# Patient Record
Sex: Female | Born: 1975 | Race: White | Hispanic: No | Marital: Married | Smoking: Never smoker
Health system: Southern US, Community
[De-identification: ages and names within clinical notes are randomized; demographics above are authoritative.]

## PROBLEM LIST (undated history)

## (undated) DIAGNOSIS — F329 Major depressive disorder, single episode, unspecified: Secondary | ICD-10-CM

## (undated) DIAGNOSIS — F32A Depression, unspecified: Secondary | ICD-10-CM

## (undated) DIAGNOSIS — Z6791 Unspecified blood type, Rh negative: Secondary | ICD-10-CM

## (undated) DIAGNOSIS — K649 Unspecified hemorrhoids: Secondary | ICD-10-CM

## (undated) DIAGNOSIS — O26899 Other specified pregnancy related conditions, unspecified trimester: Secondary | ICD-10-CM

## (undated) DIAGNOSIS — E039 Hypothyroidism, unspecified: Secondary | ICD-10-CM

## (undated) HISTORY — DX: Unspecified hemorrhoids: K64.9

## (undated) HISTORY — DX: Major depressive disorder, single episode, unspecified: F32.9

## (undated) HISTORY — PX: EYE SURGERY: SHX253

## (undated) HISTORY — DX: Depression, unspecified: F32.A

## (undated) HISTORY — DX: Hypothyroidism, unspecified: E03.9

## (undated) HISTORY — PX: WISDOM TOOTH EXTRACTION: SHX21

---

## 2006-10-05 LAB — HM MAMMOGRAPHY: HM Mammogram: NORMAL

## 2011-05-21 NOTE — L&D Delivery Note (Signed)
Delivery Note At 1:29 PM a viable and healthy female was delivered via  (Presentation:LOA ).  APGAR:9 ,9 ; weight pending .   Placenta status: spontaneous, intact . 3 vessel Cord:  with the following complications: none .  Cord pH: na  Anesthesia:  Local Episiotomy: none Lacerations: second degree Suture Repair: 2.0 vicryl rapide Est. Blood Loss (mL): 300  Mom to postpartum.  Baby to nursery-stable.  [name] J 02/10/2012, 1:43 PM

## 2011-06-26 LAB — OB RESULTS CONSOLE GC/CHLAMYDIA: Chlamydia: NEGATIVE

## 2011-11-18 LAB — OB RESULTS CONSOLE HEPATITIS B SURFACE ANTIGEN: Hepatitis B Surface Ag: NEGATIVE

## 2011-11-18 LAB — OB RESULTS CONSOLE ABO/RH

## 2011-11-18 LAB — OB RESULTS CONSOLE RUBELLA ANTIBODY, IGM: Rubella: IMMUNE

## 2011-11-18 LAB — OB RESULTS CONSOLE ANTIBODY SCREEN: Antibody Screen: NEGATIVE

## 2012-01-27 ENCOUNTER — Telehealth (HOSPITAL_COMMUNITY): Payer: Self-pay | Admitting: *Deleted

## 2012-01-27 ENCOUNTER — Encounter (HOSPITAL_COMMUNITY): Payer: Self-pay | Admitting: *Deleted

## 2012-01-27 NOTE — Telephone Encounter (Signed)
Preadmission screen  

## 2012-01-30 ENCOUNTER — Other Ambulatory Visit: Payer: Self-pay | Admitting: Obstetrics and Gynecology

## 2012-02-06 ENCOUNTER — Other Ambulatory Visit: Payer: Self-pay | Admitting: Obstetrics and Gynecology

## 2012-02-09 ENCOUNTER — Encounter (HOSPITAL_COMMUNITY): Payer: Self-pay | Admitting: Pharmacist

## 2012-02-10 ENCOUNTER — Encounter (HOSPITAL_COMMUNITY): Payer: Self-pay

## 2012-02-10 ENCOUNTER — Inpatient Hospital Stay (HOSPITAL_COMMUNITY)
Admission: RE | Admit: 2012-02-10 | Discharge: 2012-02-12 | DRG: 373 | Disposition: A | Discharge status: Home / Self Care | Payer: BC Managed Care – PPO | Source: Ambulatory Visit | Attending: Obstetrics and Gynecology | Admitting: Obstetrics and Gynecology

## 2012-02-10 DIAGNOSIS — O26899 Other specified pregnancy related conditions, unspecified trimester: Secondary | ICD-10-CM | POA: Diagnosis present

## 2012-02-10 DIAGNOSIS — Z6791 Unspecified blood type, Rh negative: Secondary | ICD-10-CM

## 2012-02-10 DIAGNOSIS — O99892 Other specified diseases and conditions complicating childbirth: Secondary | ICD-10-CM | POA: Diagnosis present

## 2012-02-10 HISTORY — DX: Unspecified blood type, rh negative: Z67.91

## 2012-02-10 HISTORY — DX: Other specified pregnancy related conditions, unspecified trimester: O26.899

## 2012-02-10 LAB — TYPE AND SCREEN
ABO/RH(D): B NEG
DAT, IgG: NEGATIVE

## 2012-02-10 LAB — RPR: RPR Ser Ql: NONREACTIVE

## 2012-02-10 LAB — CBC
HCT: 31.7 % — ABNORMAL LOW (ref 36.0–46.0)
Hemoglobin: 10.8 g/dL — ABNORMAL LOW (ref 12.0–15.0)
MCH: 29.8 pg (ref 26.0–34.0)
RBC: 3.63 MIL/uL — ABNORMAL LOW (ref 3.87–5.11)

## 2012-02-10 MED ORDER — INFLUENZA VIRUS VACC SPLIT PF IM SUSP
0.5000 mL | INTRAMUSCULAR | Status: AC
  Administered 2012-02-11: 0.5 mL via INTRAMUSCULAR
  Filled 2012-02-10: qty 0.5

## 2012-02-10 MED ORDER — OXYCODONE-ACETAMINOPHEN 5-325 MG PO TABS: 1.0000 | ORAL_TABLET | ORAL | Status: DC | PRN

## 2012-02-10 MED ORDER — TERBUTALINE SULFATE 1 MG/ML IJ SOLN: 0.2500 mg | Freq: Once | INTRAMUSCULAR | Status: DC | PRN

## 2012-02-10 MED ORDER — ONDANSETRON HCL 4 MG/2ML IJ SOLN: 4.0000 mg | INTRAMUSCULAR | Status: DC | PRN

## 2012-02-10 MED ORDER — LANOLIN HYDROUS EX OINT: TOPICAL_OINTMENT | CUTANEOUS | Status: DC | PRN

## 2012-02-10 MED ORDER — OXYTOCIN BOLUS FROM INFUSION
500.0000 mL | Freq: Once | INTRAVENOUS | Status: AC
  Administered 2012-02-10: 500 mL via INTRAVENOUS
  Filled 2012-02-10: qty 500

## 2012-02-10 MED ORDER — HYDROCORTISONE 2.5 % RE CREA
1.0000 "application " | TOPICAL_CREAM | Freq: Two times a day (BID) | RECTAL | Status: DC
  Filled 2012-02-10: qty 28.35

## 2012-02-10 MED ORDER — BENZOCAINE-MENTHOL 20-0.5 % EX AERO
1.0000 "application " | INHALATION_SPRAY | CUTANEOUS | Status: DC | PRN
  Administered 2012-02-11: 1 via TOPICAL
  Filled 2012-02-10: qty 56

## 2012-02-10 MED ORDER — PRENATAL MULTIVITAMIN CH
1.0000 | ORAL_TABLET | Freq: Every day | ORAL | Status: DC
  Administered 2012-02-10 – 2012-02-12 (×3): 1 via ORAL
  Filled 2012-02-10 (×3): qty 1

## 2012-02-10 MED ORDER — ONDANSETRON HCL 4 MG/2ML IJ SOLN: 4.0000 mg | Freq: Four times a day (QID) | INTRAMUSCULAR | Status: DC | PRN

## 2012-02-10 MED ORDER — LIDOCAINE HCL (PF) 1 % IJ SOLN
30.0000 mL | INTRAMUSCULAR | Status: DC | PRN
  Filled 2012-02-10: qty 30

## 2012-02-10 MED ORDER — WITCH HAZEL-GLYCERIN EX PADS
1.0000 "application " | MEDICATED_PAD | CUTANEOUS | Status: DC | PRN
  Administered 2012-02-11: 1 via TOPICAL

## 2012-02-10 MED ORDER — LACTATED RINGERS IV SOLN
INTRAVENOUS | Status: DC
Start: 2012-02-10 — End: 2012-02-10
  Administered 2012-02-10: 07:00:00 via INTRAVENOUS

## 2012-02-10 MED ORDER — ACETAMINOPHEN 325 MG PO TABS: 650.0000 mg | ORAL_TABLET | ORAL | Status: DC | PRN

## 2012-02-10 MED ORDER — METHYLERGONOVINE MALEATE 0.2 MG PO TABS: 0.2000 mg | ORAL_TABLET | ORAL | Status: DC | PRN

## 2012-02-10 MED ORDER — FLEET ENEMA 7-19 GM/118ML RE ENEM: 1.0000 | ENEMA | RECTAL | Status: DC | PRN

## 2012-02-10 MED ORDER — TETANUS-DIPHTH-ACELL PERTUSSIS 5-2.5-18.5 LF-MCG/0.5 IM SUSP: 0.5000 mL | Freq: Once | INTRAMUSCULAR | Status: DC

## 2012-02-10 MED ORDER — OXYTOCIN 40 UNITS IN LACTATED RINGERS INFUSION - SIMPLE MED
1.0000 m[IU]/min | INTRAVENOUS | Status: DC
  Administered 2012-02-10: 2 m[IU]/min via INTRAVENOUS
  Filled 2012-02-10: qty 1000

## 2012-02-10 MED ORDER — CITRIC ACID-SODIUM CITRATE 334-500 MG/5ML PO SOLN
30.0000 mL | ORAL | Status: DC | PRN
Start: 2012-02-10 — End: 2012-02-10

## 2012-02-10 MED ORDER — BUTORPHANOL TARTRATE 1 MG/ML IJ SOLN
1.0000 mg | INTRAMUSCULAR | Status: DC | PRN
  Administered 2012-02-10: 2 mg via INTRAVENOUS
  Filled 2012-02-10: qty 2

## 2012-02-10 MED ORDER — SIMETHICONE 80 MG PO CHEW: 80.0000 mg | CHEWABLE_TABLET | ORAL | Status: DC | PRN

## 2012-02-10 MED ORDER — IBUPROFEN 600 MG PO TABS
600.0000 mg | ORAL_TABLET | Freq: Four times a day (QID) | ORAL | Status: DC
  Administered 2012-02-10 – 2012-02-12 (×8): 600 mg via ORAL
  Filled 2012-02-10 (×9): qty 1

## 2012-02-10 MED ORDER — HYDROCORTISONE ACETATE 25 MG RE SUPP: 25.0000 mg | Freq: Two times a day (BID) | RECTAL | Status: DC

## 2012-02-10 MED ORDER — OXYTOCIN 40 UNITS IN LACTATED RINGERS INFUSION - SIMPLE MED
62.5000 mL/h | Freq: Once | INTRAVENOUS | Status: AC
  Administered 2012-02-10: 62.5 mL/h via INTRAVENOUS

## 2012-02-10 MED ORDER — DIBUCAINE 1 % RE OINT: 1.0000 "application " | TOPICAL_OINTMENT | RECTAL | Status: DC | PRN

## 2012-02-10 MED ORDER — METHYLERGONOVINE MALEATE 0.2 MG/ML IJ SOLN: 0.2000 mg | INTRAMUSCULAR | Status: DC | PRN

## 2012-02-10 MED ORDER — LEVOTHYROXINE SODIUM 50 MCG PO TABS
50.0000 ug | ORAL_TABLET | Freq: Every day | ORAL | Status: DC
  Filled 2012-02-10 (×3): qty 1

## 2012-02-10 MED ORDER — SENNOSIDES-DOCUSATE SODIUM 8.6-50 MG PO TABS
2.0000 | ORAL_TABLET | Freq: Every day | ORAL | Status: DC
  Administered 2012-02-10 – 2012-02-11 (×2): 2 via ORAL

## 2012-02-10 MED ORDER — ZOLPIDEM TARTRATE 5 MG PO TABS: 5.0000 mg | ORAL_TABLET | Freq: Every evening | ORAL | Status: DC | PRN

## 2012-02-10 MED ORDER — IBUPROFEN 600 MG PO TABS: 600.0000 mg | ORAL_TABLET | Freq: Four times a day (QID) | ORAL | Status: DC | PRN

## 2012-02-10 MED ORDER — DIPHENHYDRAMINE HCL 25 MG PO CAPS: 25.0000 mg | ORAL_CAPSULE | Freq: Four times a day (QID) | ORAL | Status: DC | PRN

## 2012-02-10 MED ORDER — LACTATED RINGERS IV SOLN: 500.0000 mL | INTRAVENOUS | Status: DC | PRN

## 2012-02-10 MED ORDER — ONDANSETRON HCL 4 MG PO TABS: 4.0000 mg | ORAL_TABLET | ORAL | Status: DC | PRN

## 2012-02-10 NOTE — Progress Notes (Signed)
Kristina Casey is a 36 y.o. [phone] at [redacted]w[redacted]d by LMP admitted for induction of labor due to Elective at term and ( husband with medical illness).  Subjective: comfortable  Objective: BP 106/78  Pulse 84  Temp 97.9 F (36.6 C) (Oral)  Resp 20  Ht 5\' 3"  (1.6 m)  Wt 85.276 kg (188 lb)  BMI 33.30 kg/m2  LMP 05/05/2011      FHT:  FHR: 145 bpm, variability: moderate,  accelerations:  Present,  decelerations:  Absent UC:   none SVE:   Dilation: 1.5 Effacement (%): 60 Station: -2 Exam by:: Dr. Billy Coast AROM-clear  Labs: Lab Results  Component Value Date   WBC 7.4 02/10/2012   HGB 10.8* 02/10/2012   HCT 31.7* 02/10/2012   MCV 87.3 02/10/2012   PLT 146* 02/10/2012    Assessment / Plan: Induction of labor due to term multip with favorable cervix and poor social situation,  progressing well on pitocin  Labor: Progressing on Pitocin, will continue to increase then AROM Preeclampsia:  na Fetal Wellbeing:  Category I Pain Control:  Labor support without medications I/D:  n/a Anticipated MOD:  NSVD  Anali Cabanilla J 02/10/2012, 8:14 AM

## 2012-02-10 NOTE — H&P (Signed)
Kristina Casey, Kristina Casey                 ACCOUNT NO.:  [id number]  MEDICAL RECORD NO.:  [id number]  LOCATION:  9162                          FACILITY:  [facility]  PHYSICIAN:  Lenoard Aden, M.D.DATE OF BIRTH:  [birth year]  DATE OF ADMISSION:  02/10/2012 DATE OF DISCHARGE:                             HISTORY & PHYSICAL   CHIEF COMPLAINT:  Induction.  HISTORY OF PRESENT ILLNESS:  She is a 36 year old white female, G4, P3 at 84 weeks' gestation for induction at term.  Husband with unspecified medical illness requiring therapy.  Due to her favorable cervix and this is new onset poor social situation involving his illness, we will proceed with time to delivery at 39 weeks.  She is aware of the risks with elective induction and possible increased risks for C-section.  She acknowledges and wishes to proceed.  PAST MEDICAL HISTORY:  She has no known drug allergies.  MEDICATIONS:  Prenatal vitamins, Zyrtec as needed, Claritin as needed, Anucort HC, thyroid replacement therapy, Vitamin D.  She is a nonsmoker, nondrinker.  Denies domestic or physical violence.  FAMILY HISTORY:  Hypertension, diabetes, and thyroid disease.  She has a history of vaginal delivery x3 with also a history of postpartum depression.  PHYSICAL EXAM:  GENERAL:  This is a well-developed, well-nourished white female, in no acute distress. HEENT:  Normal. NECK:  Supple.  Full range of motion. LUNGS:  Clear. HEART:  Regular rhythm. ABDOMEN:  Soft, gravid, nontender.  Estimated fetal weight 7 pounds. Cervix is 2 to 3 cm, 100%, vertex, -1. EXTREMITIES:  There are no cords. NEUROLOGIC:  Nonfocal. SKIN:  Intact.  IMPRESSION: 1. A 39-week intrauterine pregnancy. 2. Special medical or circumstances involving her husband with     decision to proceed with induction at term.  PLAN:  Pitocin, epidural, or IV analgesia as needed.  Anticipate attempts at vaginal delivery.     Lenoard Aden, M.D.     RJT/MEDQ  D:   02/10/2012  T:  02/10/2012  Job:  [id number]

## 2012-02-11 ENCOUNTER — Encounter (HOSPITAL_COMMUNITY): Payer: Self-pay

## 2012-02-11 DIAGNOSIS — O26899 Other specified pregnancy related conditions, unspecified trimester: Secondary | ICD-10-CM

## 2012-02-11 HISTORY — DX: Other specified pregnancy related conditions, unspecified trimester: O26.899

## 2012-02-11 LAB — CBC
HCT: 29.5 % — ABNORMAL LOW (ref 36.0–46.0)
Hemoglobin: 10 g/dL — ABNORMAL LOW (ref 12.0–15.0)
MCH: 30.2 pg (ref 26.0–34.0)
MCHC: 33.9 g/dL (ref 30.0–36.0)
MCV: 89.1 fL (ref 78.0–100.0)
Platelets: 145 10*3/uL — ABNORMAL LOW (ref 150–400)
RBC: 3.31 MIL/uL — ABNORMAL LOW (ref 3.87–5.11)
RDW: 14 % (ref 11.5–15.5)
WBC: 7.4 10*3/uL (ref 4.0–10.5)

## 2012-02-11 MED ORDER — RHO D IMMUNE GLOBULIN 1500 UNIT/2ML IJ SOLN
300.0000 ug | Freq: Once | INTRAMUSCULAR | Status: AC
  Administered 2012-02-11: 300 ug via INTRAMUSCULAR
  Filled 2012-02-11: qty 2

## 2012-02-11 NOTE — Progress Notes (Signed)
PPD 1 SVD  S:  Reports feeling well.             Tolerating po/ No nausea or vomiting             Bleeding is light             Pain controlled with Motrin.             Up ad lib / ambulatory / voiding well   Newborn  Information for the patient's newborn:  Hosanna, Betley [[id number]]  female  breast and bottle feeding  / Circumcision planned   O:  A & O x 3 NAD             VS:  Filed Vitals:   02/10/12 1645 02/10/12 2030 02/11/12 0430 02/11/12 0644  BP: 116/70 110/76 112/69 103/64  Pulse: 62 65 62 76  Temp: 98.6 F (37 C) 98.8 F (37.1 C) 98.4 F (36.9 C) 98.4 F (36.9 C)  TempSrc: Oral Oral Oral Oral  Resp: 18 18 18 18   Height:      Weight:        LABS:  Basename 02/11/12 0535 02/10/12 0700  WBC 7.4 7.4  HGB 10.0* 10.8*  HCT 29.5* 31.7*  PLT 145* 146*    Blood type: --/--/B NEG (09/23 0700)  Rubella: Immune (07/01 0000)   I&O: I/O last 3 completed shifts: In: -  Out: 300 [Blood:300]        Abdomen: soft, non-tender, non-distended              Fundus: firm, non-tender, U -1  Perineum: repair intact  Lochia: small  Extremities: no edema, no calf pain or tenderness, neg Homans    A/P: PPD # 1 36 y.o., [id number]    Active Problems:  NSVD (normal spontaneous vaginal delivery, 9/23)  Postpartum care following vaginal delivery  Perineal laceration with delivery, second degree  Rh negative, delivered, current hospitalization  -infant Rh pos, plan Rhogam prior to DC   Doing well - stable status  Routine post partum orders  Anticipate discharge home in AM.  Plan circ today  Abrahan Fulmore, CNM 02/11/2012, 9:41 AM

## 2012-02-12 ENCOUNTER — Inpatient Hospital Stay (HOSPITAL_COMMUNITY): Admission: AD | Admit: 2012-02-12 | Payer: Self-pay | Source: Ambulatory Visit | Admitting: Obstetrics and Gynecology

## 2012-02-12 LAB — RH IG WORKUP (INCLUDES ABO/RH)

## 2012-02-12 MED ORDER — IBUPROFEN 600 MG PO TABS: 600.0000 mg | ORAL_TABLET | Freq: Four times a day (QID) | ORAL | Status: DC

## 2012-02-12 NOTE — Progress Notes (Signed)
Post Partum Day #2            Information for the patient's newborn:  Jenesa, Schmeer [[id number]]  female   / circumcision done Feeding: breast and bottle  Subjective: No HA, SOB, CP, F/C, breast symptoms. Pain minimal. Normal vaginal bleeding, no clots.      Objective:  Temp:  [97.4 F (36.3 C)-98.1 F (36.7 C)] 98.1 F (36.7 C) (09/25 0543) Pulse Rate:  [62-70] 70  (09/25 0543) Resp:  [18] 18  (09/25 0543) BP: (92-110)/(53-75) 92/53 mmHg (09/25 0543)       Basename 02/11/12 0535 02/10/12 0700  WBC 7.4 7.4  HGB 10.0* 10.8*  HCT 29.5* 31.7*  PLT 145* 146*    Blood type: --/--/B NEG (09/24 0535) / Rhogam given Rubella: Immune (07/01 0000)    Physical Exam:  General: alert, cooperative and no distress Uterine Fundus: firm Lochia: appropriate Perineum: repair intact, edema none, flaccid cluster hemorrhoidal tags noted DVT Evaluation: Negative Homan's sign. No significant calf/ankle edema.    Assessment/Plan: PPD # 2 / 36 y.o., [id number] S/P:induced vaginal   Active Problems:  NSVD (normal spontaneous vaginal delivery, 9/23)  Postpartum care following vaginal delivery  Perineal laceration with delivery, second degree  Rh negative, delivered, current hospitalization  Rhogam given TDaP and flu vaccine up to date   normal postpartum exam  Continue current postpartum care  D/C home   LOS: 2 days   PAUL,DANIELA, CNM, MSN 02/12/2012, 9:00 AM

## 2012-02-12 NOTE — Discharge Summary (Addendum)
Obstetric Discharge Summary Reason for Admission: induction of labor Prenatal Procedures: ultrasound Intrapartum Procedures: spontaneous vaginal delivery and epidural Postpartum Procedures: Rho(D) Ig Complications-Operative and Postpartum: 2nd degree perineal laceration Hemoglobin  Date Value Range Status  02/11/2012 10.0* 12.0 - 15.0 g/dL Final     HCT  Date Value Range Status  02/11/2012 29.5* 36.0 - 46.0 % Final    Physical Exam:  General: alert, cooperative and no distress Lochia: appropriate Uterine Fundus: firm Incision: healing well DVT Evaluation: Negative Homan's sign. No significant calf/ankle edema.  Discharge Diagnoses: Term Pregnancy-delivered  Discharge Information: Date: 02/12/2012 Activity: pelvic rest Diet: routine Medications: PNV Condition: stable Instructions: refer to practice specific booklet Discharge to: home Follow-up Information    Follow up with Lenoard Aden, MD. Schedule an appointment as soon as possible for a visit in 6 weeks.   Contact information:   [address] [place] [address] [phone]          Newborn Data: Live born female  Birth Weight: 7 lb 1.4 oz (3215 g) APGAR: 8, 9  Home with mother.  PAUL,DANIELA 02/12/2012, 9:09 AM

## 2013-10-04 LAB — HM PAP SMEAR: HM PAP: NORMAL

## 2014-03-21 ENCOUNTER — Encounter (HOSPITAL_COMMUNITY): Payer: Self-pay

## 2014-09-16 ENCOUNTER — Emergency Department (HOSPITAL_BASED_OUTPATIENT_CLINIC_OR_DEPARTMENT_OTHER): Payer: 59

## 2014-09-16 ENCOUNTER — Observation Stay (HOSPITAL_BASED_OUTPATIENT_CLINIC_OR_DEPARTMENT_OTHER)
Admission: EM | Admit: 2014-09-16 | Discharge: 2014-09-17 | Disposition: A | Discharge status: Home / Self Care | Payer: 59 | Attending: Emergency Medicine | Admitting: Emergency Medicine

## 2014-09-16 ENCOUNTER — Encounter (HOSPITAL_BASED_OUTPATIENT_CLINIC_OR_DEPARTMENT_OTHER): Payer: Self-pay

## 2014-09-16 DIAGNOSIS — R1013 Epigastric pain: Secondary | ICD-10-CM

## 2014-09-16 DIAGNOSIS — K760 Fatty (change of) liver, not elsewhere classified: Secondary | ICD-10-CM | POA: Insufficient documentation

## 2014-09-16 DIAGNOSIS — R070 Pain in throat: Secondary | ICD-10-CM | POA: Diagnosis present

## 2014-09-16 DIAGNOSIS — M545 Low back pain: Secondary | ICD-10-CM | POA: Diagnosis not present

## 2014-09-16 DIAGNOSIS — Z880 Allergy status to penicillin: Secondary | ICD-10-CM | POA: Diagnosis not present

## 2014-09-16 DIAGNOSIS — R7989 Other specified abnormal findings of blood chemistry: Secondary | ICD-10-CM

## 2014-09-16 DIAGNOSIS — K2970 Gastritis, unspecified, without bleeding: Secondary | ICD-10-CM | POA: Diagnosis not present

## 2014-09-16 DIAGNOSIS — R52 Pain, unspecified: Secondary | ICD-10-CM

## 2014-09-16 DIAGNOSIS — E039 Hypothyroidism, unspecified: Secondary | ICD-10-CM | POA: Diagnosis not present

## 2014-09-16 DIAGNOSIS — R109 Unspecified abdominal pain: Secondary | ICD-10-CM | POA: Diagnosis not present

## 2014-09-16 DIAGNOSIS — R945 Abnormal results of liver function studies: Secondary | ICD-10-CM

## 2014-09-16 LAB — CBC WITH DIFFERENTIAL/PLATELET
Basophils Absolute: 0 10*3/uL (ref 0.0–0.1)
Basophils Relative: 0 % (ref 0–1)
EOS PCT: 1 % (ref 0–5)
Eosinophils Absolute: 0.1 10*3/uL (ref 0.0–0.7)
HEMATOCRIT: 37.7 % (ref 36.0–46.0)
Hemoglobin: 12.7 g/dL (ref 12.0–15.0)
LYMPHS ABS: 1.8 10*3/uL (ref 0.7–4.0)
LYMPHS PCT: 23 % (ref 12–46)
MCH: 28 pg (ref 26.0–34.0)
MCHC: 33.7 g/dL (ref 30.0–36.0)
MCV: 83 fL (ref 78.0–100.0)
Monocytes Absolute: 0.9 10*3/uL (ref 0.1–1.0)
Monocytes Relative: 11 % (ref 3–12)
NEUTROS ABS: 5.3 10*3/uL (ref 1.7–7.7)
NEUTROS PCT: 65 % (ref 43–77)
Platelets: 152 10*3/uL (ref 150–400)
RBC: 4.54 MIL/uL (ref 3.87–5.11)
RDW: 13 % (ref 11.5–15.5)
WBC: 8.1 10*3/uL (ref 4.0–10.5)

## 2014-09-16 LAB — COMPREHENSIVE METABOLIC PANEL
ALBUMIN: 3.9 g/dL (ref 3.5–5.2)
ALT: 105 U/L — AB (ref 0–35)
AST: 76 U/L — ABNORMAL HIGH (ref 0–37)
Alkaline Phosphatase: 134 U/L — ABNORMAL HIGH (ref 39–117)
Anion gap: 5 (ref 5–15)
BUN: 13 mg/dL (ref 6–23)
CHLORIDE: 107 mmol/L (ref 96–112)
CO2: 24 mmol/L (ref 19–32)
Calcium: 8.4 mg/dL (ref 8.4–10.5)
Creatinine, Ser: 0.7 mg/dL (ref 0.50–1.10)
Glucose, Bld: 105 mg/dL — ABNORMAL HIGH (ref 70–99)
POTASSIUM: 3.7 mmol/L (ref 3.5–5.1)
SODIUM: 136 mmol/L (ref 135–145)
Total Bilirubin: 0.4 mg/dL (ref 0.3–1.2)
Total Protein: 7.1 g/dL (ref 6.0–8.3)

## 2014-09-16 LAB — URINALYSIS, ROUTINE W REFLEX MICROSCOPIC
Bilirubin Urine: NEGATIVE
GLUCOSE, UA: NEGATIVE mg/dL
HGB URINE DIPSTICK: NEGATIVE
KETONES UR: NEGATIVE mg/dL
NITRITE: NEGATIVE
PROTEIN: NEGATIVE mg/dL
Specific Gravity, Urine: 1.022 (ref 1.005–1.030)
Urobilinogen, UA: 0.2 mg/dL (ref 0.0–1.0)
pH: 6 (ref 5.0–8.0)

## 2014-09-16 LAB — PREGNANCY, URINE: PREG TEST UR: NEGATIVE

## 2014-09-16 LAB — RAPID STREP SCREEN (MED CTR MEBANE ONLY): Streptococcus, Group A Screen (Direct): NEGATIVE

## 2014-09-16 LAB — URINE MICROSCOPIC-ADD ON

## 2014-09-16 LAB — LIPASE, BLOOD: LIPASE: 28 U/L (ref 11–59)

## 2014-09-16 MED ORDER — LEVOFLOXACIN IN D5W 500 MG/100ML IV SOLN
500.0000 mg | Freq: Once | INTRAVENOUS | Status: AC
  Administered 2014-09-17: 500 mg via INTRAVENOUS
  Filled 2014-09-16: qty 100

## 2014-09-16 MED ORDER — SODIUM CHLORIDE 0.9 % IV SOLN: 1000.0000 mL | INTRAVENOUS | Status: DC

## 2014-09-16 MED ORDER — MORPHINE SULFATE 4 MG/ML IJ SOLN
4.0000 mg | Freq: Once | INTRAMUSCULAR | Status: AC
  Administered 2014-09-16: 4 mg via INTRAVENOUS
  Filled 2014-09-16: qty 1

## 2014-09-16 MED ORDER — FAMOTIDINE 20 MG PO TABS
20.0000 mg | ORAL_TABLET | Freq: Once | ORAL | Status: AC
  Administered 2014-09-16: 20 mg via ORAL
  Filled 2014-09-16: qty 1

## 2014-09-16 MED ORDER — GI COCKTAIL ~~LOC~~
ORAL | Status: AC
  Administered 2014-09-16: 30 mL
  Filled 2014-09-16: qty 30

## 2014-09-16 MED ORDER — METRONIDAZOLE IN NACL 5-0.79 MG/ML-% IV SOLN
500.0000 mg | Freq: Once | INTRAVENOUS | Status: AC
  Administered 2014-09-16: 500 mg via INTRAVENOUS
  Filled 2014-09-16: qty 100

## 2014-09-16 MED ORDER — SODIUM CHLORIDE 0.9 % IV SOLN
1000.0000 mL | Freq: Once | INTRAVENOUS | Status: AC
  Administered 2014-09-16: 1000 mL via INTRAVENOUS

## 2014-09-16 MED ORDER — PANTOPRAZOLE SODIUM 40 MG IV SOLR
40.0000 mg | Freq: Once | INTRAVENOUS | Status: AC
Start: 2014-09-16 — End: 2014-09-16
  Administered 2014-09-16: 40 mg via INTRAVENOUS
  Filled 2014-09-16: qty 40

## 2014-09-16 MED ORDER — GI COCKTAIL ~~LOC~~: 30.0000 mL | Freq: Once | ORAL | Status: DC

## 2014-09-16 NOTE — ED Notes (Signed)
MD at bedside. 

## 2014-09-16 NOTE — ED Notes (Signed)
Patient transported to X-ray 

## 2014-09-16 NOTE — ED Notes (Signed)
Pt reports sore throat, generalized body ache and feels like when she swallows there is something stuck lower in esophagus.  Pt reports lower back pain too.

## 2014-09-16 NOTE — ED Provider Notes (Signed)
CSN: [id number]     Arrival date & time 09/16/14  2039 History  This chart was scribed for Arby Barrette, MD by Abel Presto, ED Scribe. This patient was seen in room MH09/MH09 and the patient's care was started at 10:02 PM.    Chief Complaint  Patient presents with  . Sore Throat    Patient is a 39 y.o. female presenting with pharyngitis. The history is provided by the patient. No language interpreter was used.  Sore Throat   HPI Comments: Edelyn Heidel is a 39 y.o. female who presents to the Emergency Department complaining of sore throat with onset yesterday. Pt states she ate and salad and red pepper soup at onset. Pt reports pain in lower esophagus radiating to epigastric region with swallowing, she likens feeling to a pressure. Pt states she has episode once a month where she feels as though food will not go down causing her to vomit. She states pain worsens with movement. Pt has never had an endoscopy. Pt notes associated fever of 102, chills, and lower back pain. She states she feels icky but reports she often feels like this when she is tired or on her menstrual period. Pt denies lower abdominal pain.   Past Medical History  Diagnosis Date  . Hypothyroidism   . Unspecified hemorrhoids without mention of complication   . Depression     PP with previous pregnancies  . NSVD (normal spontaneous vaginal delivery) 02/11/2012  . Postpartum care following vaginal delivery 02/11/2012  . Perineal laceration with delivery, second degree 02/11/2012  . Rh negative, delivered, current hospitalization 02/11/2012   Past Surgical History  Procedure Laterality Date  . Wisdom tooth extraction    . Eye surgery      lasix   Family History  Problem Relation Age of Onset  . Hypothyroidism Maternal Grandmother   . Diabetes Paternal Grandfather    History  Substance Use Topics  . Smoking status: Never Smoker   . Smokeless tobacco: Never Used  . Alcohol Use: No   OB History    Gravida Para  Term Preterm AB TAB SAB Ectopic Multiple Living   4 4 4       4      Review of Systems 10 Systems reviewed and all are negative for acute change except as noted in the HPI.     Allergies  Amoxicillin  Home Medications   Prior to Admission medications   Medication Sig Start Date End Date Taking? Authorizing Provider  hydrocortisone (ANUSOL-HC) 2.5 % rectal cream Place 1 application rectally 2 (two) times daily. For hemorrhoids    Historical Provider, MD  hydrocortisone (ANUSOL-HC) 25 MG suppository Place 25 mg rectally 2 (two) times daily. For hemorrhoids    Historical Provider, MD  ibuprofen (ADVIL,MOTRIN) 600 MG tablet Take 1 tablet (600 mg total) by mouth every 6 (six) hours. 02/12/12   Arlan Organ, CNM  levofloxacin (LEVAQUIN) 500 MG tablet Take 1 tablet (500 mg total) by mouth daily. 09/17/14   Arby Barrette, MD  levothyroxine (SYNTHROID, LEVOTHROID) 50 MCG tablet Take 50 mcg by mouth daily.    Historical Provider, MD  metroNIDAZOLE (FLAGYL) 500 MG tablet Take 1 tablet (500 mg total) by mouth 2 (two) times daily. One po bid x 7 days 09/17/14   Arby Barrette, MD  oxyCODONE (OXY IR/ROXICODONE) 5 MG immediate release tablet Take 1 tablet (5 mg total) by mouth every 4 (four) hours as needed for severe pain. 09/17/14   Arby Barrette, MD  pantoprazole (  PROTONIX) 20 MG tablet Take 1 tablet (20 mg total) by mouth daily. 09/17/14   Arby Barrette, MD  Prenatal Vit-Fe Fumarate-FA (PRENATAL MULTIVITAMIN) TABS Take 1 tablet by mouth daily.    Historical Provider, MD  promethazine (PHENERGAN) 25 MG tablet Take 1 tablet (25 mg total) by mouth every 6 (six) hours as needed for nausea or vomiting. 09/17/14   Arby Barrette, MD   BP 91/52 mmHg  Pulse 78  Temp(Src) 98.9 F (37.2 C) (Oral)  Resp 16  Ht  (1.6 m)  Wt 180 lb (81.647 kg)  BMI 31.89 kg/m2  SpO2 98%  LMP 08/21/2014 Physical Exam  Constitutional: She is oriented to person, place, and time. She appears well-developed and  well-nourished.  HENT:  Head: Normocephalic and atraumatic.  Eyes: EOM are normal. Pupils are equal, round, and reactive to light.  Neck: Neck supple.  Cardiovascular: Normal rate, regular rhythm, normal heart sounds and intact distal pulses.   Pulmonary/Chest: Effort normal and breath sounds normal.  Abdominal: Soft. Bowel sounds are normal. She exhibits no distension. There is no tenderness.  Musculoskeletal: Normal range of motion. She exhibits no edema.  Neurological: She is alert and oriented to person, place, and time. She has normal strength. Coordination normal. GCS eye subscore is 4. GCS verbal subscore is 5. GCS motor subscore is 6.  Skin: Skin is warm, dry and intact.  Psychiatric: She has a normal mood and affect.    ED Course  Procedures (including critical care time) DIAGNOSTIC STUDIES: Oxygen Saturation is 99% on room air, normal by my interpretation.    COORDINATION OF CARE: 10:25 PM Discussed treatment plan with patient at beside, the patient agrees with the plan and has no further questions at this time.   Labs Review Labs Reviewed  URINALYSIS, ROUTINE W REFLEX MICROSCOPIC - Abnormal; Notable for the following:    APPearance CLOUDY (*)    Leukocytes, UA MODERATE (*)    All other components within normal limits  URINE MICROSCOPIC-ADD ON - Abnormal; Notable for the following:    Squamous Epithelial / LPF FEW (*)    Bacteria, UA FEW (*)    All other components within normal limits  COMPREHENSIVE METABOLIC PANEL - Abnormal; Notable for the following:    Glucose, Bld 105 (*)    AST 76 (*)    ALT 105 (*)    Alkaline Phosphatase 134 (*)    All other components within normal limits  RAPID STREP SCREEN  CULTURE, GROUP A STREP  PREGNANCY, URINE  LIPASE, BLOOD  CBC WITH DIFFERENTIAL/PLATELET    Imaging Review No results found.   EKG Interpretation None     Results for orders placed or performed during the hospital encounter of 09/16/14  Rapid strep screen   Result Value Ref Range   Streptococcus, Group A Screen (Direct) NEGATIVE NEGATIVE  Culture, Group A Strep  Result Value Ref Range   Strep A Culture Negative   Urinalysis, Routine w reflex microscopic  Result Value Ref Range   Color, Urine YELLOW YELLOW   APPearance CLOUDY (A) CLEAR   Specific Gravity, Urine 1.022 1.005 - 1.030   pH 6.0 5.0 - 8.0   Glucose, UA NEGATIVE NEGATIVE mg/dL   Hgb urine dipstick NEGATIVE NEGATIVE   Bilirubin Urine NEGATIVE NEGATIVE   Ketones, ur NEGATIVE NEGATIVE mg/dL   Protein, ur NEGATIVE NEGATIVE mg/dL   Urobilinogen, UA 0.2 0.0 - 1.0 mg/dL   Nitrite NEGATIVE NEGATIVE   Leukocytes, UA MODERATE (A) NEGATIVE  Pregnancy,  urine  Result Value Ref Range   Preg Test, Ur NEGATIVE NEGATIVE  Urine microscopic-add on  Result Value Ref Range   Squamous Epithelial / LPF FEW (A) RARE   WBC, UA 7-10 <3 WBC/hpf   Bacteria, UA FEW (A) RARE   Urine-Other MUCOUS PRESENT   Comprehensive metabolic panel  Result Value Ref Range   Sodium 136 135 - 145 mmol/L   Potassium 3.7 3.5 - 5.1 mmol/L   Chloride 107 96 - 112 mmol/L   CO2 24 19 - 32 mmol/L   Glucose, Bld 105 (H) 70 - 99 mg/dL   BUN 13 6 - 23 mg/dL   Creatinine, Ser 2.950.70 0.50 - 1.10 mg/dL   Calcium 8.4 8.4 - 62.110.5 mg/dL   Total Protein 7.1 6.0 - 8.3 g/dL   Albumin 3.9 3.5 - 5.2 g/dL   AST 76 (H) 0 - 37 U/L   ALT 105 (H) 0 - 35 U/L   Alkaline Phosphatase 134 (H) 39 - 117 U/L   Total Bilirubin 0.4 0.3 - 1.2 mg/dL   GFR calc non Af Amer >90 >90 mL/min   GFR calc Af Amer >90 >90 mL/min   Anion gap 5 5 - 15  Lipase, blood  Result Value Ref Range   Lipase 28 11 - 59 U/L  CBC with Differential  Result Value Ref Range   WBC 8.1 4.0 - 10.5 K/uL   RBC 4.54 3.87 - 5.11 MIL/uL   Hemoglobin 12.7 12.0 - 15.0 g/dL   HCT 30.837.7 65.736.0 - 84.646.0 %   MCV 83.0 78.0 - 100.0 fL   MCH 28.0 26.0 - 34.0 pg   MCHC 33.7 30.0 - 36.0 g/dL   RDW 96.213.0 95.211.5 - 84.115.5 %   Platelets 152 150 - 400 K/uL   Neutrophils Relative % 65 43 - 77  %   Neutro Abs 5.3 1.7 - 7.7 K/uL   Lymphocytes Relative 23 12 - 46 %   Lymphs Abs 1.8 0.7 - 4.0 K/uL   Monocytes Relative 11 3 - 12 %   Monocytes Absolute 0.9 0.1 - 1.0 K/uL   Eosinophils Relative 1 0 - 5 %   Eosinophils Absolute 0.1 0.0 - 0.7 K/uL   Basophils Relative 0 0 - 1 %   Basophils Absolute 0.0 0.0 - 0.1 K/uL   [place] Abdomen Complete  09/17/2014   CLINICAL DATA:  Sensation of food stuck in epigastric region. Elevated LFTs. Nausea. Body aches, fever chills. Initial encounter.  EXAM: ULTRASOUND ABDOMEN COMPLETE  COMPARISON:  None.  FINDINGS: Gallbladder: No gallstones or wall thickening visualized. No sonographic Murphy sign noted.  Common bile duct: Diameter: 0.2 cm, within normal limits in caliber.  Liver: No focal lesion identified. Mildly increased parenchymal echogenicity, likely reflecting fatty infiltration.  IVC: No abnormality visualized.  Pancreas: Visualized portion unremarkable, though difficult to fully characterize.  Spleen: Size and appearance within normal limits.  Right Kidney: Length: 10.8 cm. Echogenicity within normal limits. No mass or hydronephrosis visualized.  Left Kidney: Length: 10.7 cm. Echogenicity within normal limits. No mass or hydronephrosis visualized.  Abdominal aorta: No aneurysm visualized.  Other findings: None.  IMPRESSION: 1. No acute abnormality seen within the abdomen. 2. Mild fatty infiltration within the liver.   Electronically Signed   By: Roanna RaiderJeffery  Chang M.D.   On: 09/17/2014 01:06   Dg Abd Acute W/chest  09/16/2014   CLINICAL DATA:  Sore throat after eating salad and red pepper soup. Fever, chills and low back pain.  EXAM:  DG ABDOMEN ACUTE W/ 1V CHEST  COMPARISON:  None.  FINDINGS: Cardiomediastinal silhouette is unremarkable. Lungs are clear, no pleural effusions. No pneumothorax. Soft tissue planes and included osseous structures are unremarkable.  Bowel gas pattern is nondilated and nonobstructive. Mild amount of retained large bowel stool. IUD  projects in the pelvis. No intra-abdominal mass effect, pathologic calcifications or free air. Soft tissue planes and included osseous structures are nonsuspicious.  IMPRESSION: Normal chest radiograph.  Mild amount of retained large bowel stool, normal bowel gas pattern.   Electronically Signed   By: Awilda Metro   On: 09/16/2014 23:02     Meds ordered this encounter  Medications  . gi cocktail suspension    Sig:     Leory Plowman   : cabinet override  . famotidine (PEPCID) tablet 20 mg    Sig:   . DISCONTD: gi cocktail (Maalox,Lidocaine,Donnatal)    Sig:   . metroNIDAZOLE (FLAGYL) IVPB 500 mg    Sig:     Order Specific Question:  Antibiotic Indication:    Answer:  Intra-abdominal Infection  . levofloxacin (LEVAQUIN) IVPB 500 mg    Sig:     Order Specific Question:  Antibiotic Indication:    Answer:  Intra-abdominal Infection  . morphine 4 MG/ML injection 4 mg    Sig:   . pantoprazole (PROTONIX) injection 40 mg    Sig:   . 0.9 %  sodium chloride infusion    Sig:   . DISCONTD: 0.9 %  sodium chloride infusion    Sig:   . pantoprazole (PROTONIX) 20 MG tablet    Sig: Take 1 tablet (20 mg total) by mouth daily.    Dispense:  30 tablet    Refill:  00  . levofloxacin (LEVAQUIN) 500 MG tablet    Sig: Take 1 tablet (500 mg total) by mouth daily.    Dispense:  7 tablet    Refill:  0  . metroNIDAZOLE (FLAGYL) 500 MG tablet    Sig: Take 1 tablet (500 mg total) by mouth 2 (two) times daily. One po bid x 7 days    Dispense:  14 tablet    Refill:  0  . oxyCODONE (OXY IR/ROXICODONE) 5 MG immediate release tablet    Sig: Take 1 tablet (5 mg total) by mouth every 4 (four) hours as needed for severe pain.    Dispense:  20 tablet    Refill:  0  . promethazine (PHENERGAN) 25 MG tablet    Sig: Take 1 tablet (25 mg total) by mouth every 6 (six) hours as needed for nausea or vomiting.    Dispense:  20 tablet    Refill:  0    10:25 PM Pt reports no change in pain with GI cocktail.   Consult general surgery: Dr. Ezzard Standing requests further diagnostic tests for elevated LFTs and fever to identify surgical etiology. MDM   Final diagnoses:  Epigastric pain  Elevated LFTs  Gastritis   At this time ultrasound does not show any cholecystitis or duct dilation. Patient has mild LFT elevation. Her symptoms are more suggestive of esophageal gastritis and possibly stricture. This has been an incrementally developing problem. Patient denies any prior history of undergoing upper endoscopy. At this time the patient will be advised for daily use of a PPI and necessary GI and PCP follow-up. The patient is otherwise well in appearance and nontoxic. There does not appear to be any acute surgical problem. The patient can tolerate by mouth's and is not  routinely experiencing esophageal impaction. She describes a symptom of dysphasia and sticking that then resolves. She is counseled on significant importance of upper endoscopy however at this time there does not appear to be any emergent stricture or closure.   Arby Barrette, MD 09/20/14 1136

## 2014-09-17 DIAGNOSIS — R109 Unspecified abdominal pain: Secondary | ICD-10-CM | POA: Diagnosis not present

## 2014-09-17 MED ORDER — METRONIDAZOLE 500 MG PO TABS: 500.0000 mg | ORAL_TABLET | Freq: Two times a day (BID) | ORAL | Status: DC

## 2014-09-17 MED ORDER — LEVOFLOXACIN 500 MG PO TABS: 500.0000 mg | ORAL_TABLET | Freq: Every day | ORAL | Status: DC

## 2014-09-17 MED ORDER — PROMETHAZINE HCL 25 MG PO TABS: 25.0000 mg | ORAL_TABLET | Freq: Four times a day (QID) | ORAL | Status: DC | PRN

## 2014-09-17 MED ORDER — OXYCODONE HCL 5 MG PO TABS: 5.0000 mg | ORAL_TABLET | ORAL | Status: DC | PRN

## 2014-09-17 MED ORDER — PANTOPRAZOLE SODIUM 20 MG PO TBEC: 20.0000 mg | DELAYED_RELEASE_TABLET | Freq: Every day | ORAL | Status: AC

## 2014-09-17 NOTE — Discharge Instructions (Signed)
Gastroesophageal Reflux Disease, Adult Gastroesophageal reflux disease (GERD) happens when acid from your stomach flows up into the esophagus. When acid comes in contact with the esophagus, the acid causes soreness (inflammation) in the esophagus. Over time, GERD may create small holes (ulcers) in the lining of the esophagus. CAUSES   Increased body weight. This puts pressure on the stomach, making acid rise from the stomach into the esophagus.  Smoking. This increases acid production in the stomach.  Drinking alcohol. This causes decreased pressure in the lower esophageal sphincter (valve or ring of muscle between the esophagus and stomach), allowing acid from the stomach into the esophagus.  Late evening meals and a full stomach. This increases pressure and acid production in the stomach.  A malformed lower esophageal sphincter. Sometimes, no cause is found. SYMPTOMS   Burning pain in the lower part of the mid-chest behind the breastbone and in the mid-stomach area. This may occur twice a week or more often.  Trouble swallowing.  Sore throat.  Dry cough.  Asthma-like symptoms including chest tightness, shortness of breath, or wheezing. DIAGNOSIS  Your caregiver may be able to diagnose GERD based on your symptoms. In some cases, X-rays and other tests may be done to check for complications or to check the condition of your stomach and esophagus. TREATMENT  Your caregiver may recommend over-the-counter or prescription medicines to help decrease acid production. Ask your caregiver before starting or adding any new medicines.  HOME CARE INSTRUCTIONS   Change the factors that you can control. Ask your caregiver for guidance concerning weight loss, quitting smoking, and alcohol consumption.  Avoid foods and drinks that make your symptoms worse, such as:  Caffeine or alcoholic drinks.  Chocolate.  Peppermint or mint flavorings.  Garlic and onions.  Spicy foods.  Citrus fruits,  such as oranges, lemons, or limes.  Tomato-based foods such as sauce, chili, salsa, and pizza.  Fried and fatty foods.  Avoid lying down for the 3 hours prior to your bedtime or prior to taking a nap.  Eat small, frequent meals instead of large meals.  Wear loose-fitting clothing. Do not wear anything tight around your waist that causes pressure on your stomach.  Raise the head of your bed 6 to 8 inches with wood blocks to help you sleep. Extra pillows will not help.  Only take over-the-counter or prescription medicines for pain, discomfort, or fever as directed by your caregiver.  Do not take aspirin, ibuprofen, or other nonsteroidal anti-inflammatory drugs (NSAIDs). SEEK IMMEDIATE MEDICAL CARE IF:   You have pain in your arms, neck, jaw, teeth, or back.  Your pain increases or changes in intensity or duration.  You develop nausea, vomiting, or sweating (diaphoresis).  You develop shortness of breath, or you faint.  Your vomit is green, yellow, black, or looks like coffee grounds or blood.  Your stool is red, bloody, or black. These symptoms could be signs of other problems, such as heart disease, gastric bleeding, or esophageal bleeding. MAKE SURE YOU:   Understand these instructions.  Will watch your condition.  Will get help right away if you are not doing well or get worse. Document Released: 02/13/2005 Document Revised: 07/29/2011 Document Reviewed: 11/23/2010 [facility] Patient Information 2015 [place], [place]. This information is not intended to replace advice given to you by your health care provider. Make sure you discuss any questions you have with your health care provider. Liver Profile You have some mild elevations in your liver function tests. He will have to  follow-up with her doctor next week for continued assessment and monitoring. A liver profile is a battery of tests which helps your caregiver evaluate your liver function. The following tests are often  included in the liver profile: Alanine aminotransferase (ALT or SGPT) This is an enzyme found primarily in the liver. Abnormalities may represent liver disease. This is found in cells of the liver so when it is elevated, it has been released by damaged cells. Albumin - The serum albumin is one of the major proteins in the blood and a reflection of the general state of nutrition. This is low when the liver is unable to do its job. It is also low when protein is lost in the urine. NORMAL FINDINGS Adult/Elderly  Total protein: 6.4-8.3 g/dL or 04-54 g/L (SI units)  Albumin: 3.5-5 g/dL or 09-81 g/L (SI units)  Globulin: 2.3-3.4 g/dL  Alpha1 globulin: 1.9-1.4 g/dL or 1-3 g/L (SI units)  Alpha2 globulin: 0.6-1 g/dL or 7-82 g/L (SI units)  Beta globulin: 0.7-1.1 g/dL or 9-56 g/L (SI units) Children  Total protein  Premature infant: 4.2-7.6 g/dL  Newborn: 2.1-3.0 g/dL  Infant: 8-6.5 g/dL  Child: 7.8-4 g/dL  Albumin  Premature infant: 3-4.2 g/dL  Newborn: 6.9-6.2 g/dL  Infant: 9.5-2.8 g/dL  Child: 4-1.3 g/dL Albumin/Globulin ratio - Calculated by dividing the albumin by the globulin. It is a measure of well being.  Alkaline phosphatase - This is an enzyme which is important in diagnosing proper bone and liver functions. NORMAL FINDINGS Age / Normal Value (units/L)  0-5 days / 35-140  Less than 3 yr / 15-60  3-6 yr / 15-50  6-12 yr / 10-50  12-18 yr / 10-40  Adult / 0-35 units/L or 0-0.58 microKat/L (SI Units) (Females tend to have slightly lower levels than males)  Elderly / Slightly higher than adults Aspartate aminotransferase (AST or SGOT) - an enzyme found in skeletal and heart muscle, liver and other organs. Abnormalities may represent liver disease. This is found in cells of the liver so when it is elevated, it has been released by damaged cells. Bilirubin, Total: A chemical involved with liver functions. High concentrations may result in jaundice. Jaundice is a  yellowing of the skin and the whites of the eyes. NORMAL FINDINGS Blood  Adult/elderly/child  Total bilirubin: 0.3-1.0 mg/dL or 2.4-40 micromole/L (SI units)  Indirect bilirubin: 0.2-0.8 mg/dL or 1.0-27.2 micromole/L (SI units)  Direct bilirubin: 0.1-0.3 mg/dL or 5.3-6.6 micromole/L (SI units)  Newborn total bilirubin: 1.0-12.0 mg/dL or 44.0-347 micromole/L (SI units)  Urine0-0.02 mg/dL Ranges for normal findings may vary among different laboratories and hospitals. You should always check with your doctor after having lab work or other tests done to discuss the meaning of your test results and whether your values are considered within normal limits PREPARATION FOR TEST No preparation or fasting is necessary unless you have been informed otherwise. A blood sample is obtained by inserting a needle into a vein in the arm. MEANING OF TEST  Your caregiver will go over the test results with you and discuss the importance and meaning of your results, as well as treatment options and the need for additional tests if necessary. OBTAINING THE TEST RESULTS It is your responsibility to obtain your test results. Ask the lab or department performing the test when and how you will get your results. Document Released: 06/08/2004 Document Revised: 07/29/2011 Document Reviewed: 09/07/2013 [facility] Patient Information 2015 [place], [place]. This information is not intended to replace advice given to you by  your health care provider. Make sure you discuss any questions you have with your health care provider. ° °

## 2014-09-19 LAB — CULTURE, GROUP A STREP: STREP A CULTURE: NEGATIVE

## 2014-09-22 ENCOUNTER — Ambulatory Visit: Payer: Self-pay | Admitting: Family Medicine

## 2014-09-30 ENCOUNTER — Ambulatory Visit: Payer: Self-pay | Admitting: Family Medicine

## 2014-10-05 ENCOUNTER — Ambulatory Visit (INDEPENDENT_AMBULATORY_CARE_PROVIDER_SITE_OTHER): Payer: 59 | Admitting: Family Medicine

## 2014-10-05 ENCOUNTER — Encounter: Payer: Self-pay | Admitting: Family Medicine

## 2014-10-05 VITALS — BP 102/68 | HR 63 | Ht 63.0 in | Wt 171.0 lb

## 2014-10-05 DIAGNOSIS — M533 Sacrococcygeal disorders, not elsewhere classified: Secondary | ICD-10-CM | POA: Diagnosis not present

## 2014-10-05 DIAGNOSIS — E039 Hypothyroidism, unspecified: Secondary | ICD-10-CM | POA: Diagnosis not present

## 2014-10-05 DIAGNOSIS — R5383 Other fatigue: Secondary | ICD-10-CM

## 2014-10-05 DIAGNOSIS — E559 Vitamin D deficiency, unspecified: Secondary | ICD-10-CM

## 2014-10-05 DIAGNOSIS — G8929 Other chronic pain: Secondary | ICD-10-CM

## 2014-10-05 LAB — HM MAMMOGRAPHY: HM MAMMO: NORMAL

## 2014-10-05 NOTE — Progress Notes (Signed)
CC: Kristina Casey is a 39 y.o. female is here for Establish Care   Subjective: HPI:  Very pleasant 39 year old here to establish care.  Complains of fatigue on a daily basis occasionally feels like she could fall asleep behind the wheel. Symptoms have been present for at least the last year on a daily basis. She has a history of vitamin D deficiency but just finished 3 months of taking a weekly supplement. Symptoms did not improve or worsen while taking vitamin D. No other interventions as of yet. She has a history of hypothyroidism and she stopped taking levothyroxine a year and a half ago in hopes of managing her Thyroid issues with essential oils.  She denies weakness or shortness of breath. She had a CBC checked within the last few weeks with no signs of anemia. TSH was checked 2 weeks ago with a level of 10.6. Nothing seems to make fatigue better or worse. She describes it as moderate in severity most days of months however some days she will be completely fine with respect to fatigue. She denies snoring or witnessed apneic episodes.  Complains of low back pain localized just to the sides of the tailbone present when going from a seated to standing position. She'll have days where there is absolutely no pain and then other days where it is present to a moderate degree and severity. It's nonradiating, sharp, and therefore no interventions as of yet. She believes it's been going on for about a month and a half now. Denies any radiation or lower extremity motor or sensory disturbances denies any recent or remote trauma.  Review of Systems - General ROS: negative for - chills, fever, night sweats, weight gain or weight loss Ophthalmic ROS: negative for - decreased vision Psychological ROS: negative for - anxiety or depression ENT ROS: negative for - hearing change, nasal congestion, tinnitus or allergies Hematological and Lymphatic ROS: negative for - bleeding problems, bruising or swollen lymph  nodes Breast ROS: negative Respiratory ROS: no cough, shortness of breath, or wheezing Cardiovascular ROS: no chest pain or dyspnea on exertion Gastrointestinal ROS: no abdominal pain, change in bowel habits, or black or bloody stools Genito-Urinary ROS: negative for - genital discharge, genital ulcers, incontinence or abnormal bleeding from genitals Musculoskeletal ROS: negative for - joint pain or muscle pain other than that described above Neurological ROS: negative for - headaches or memory loss Dermatological ROS: negative for lumps, mole changes, rash and skin lesion changes  Past Medical History  Diagnosis Date  . Hypothyroidism   . Unspecified hemorrhoids without mention of complication   . Depression     PP with previous pregnancies  . NSVD (normal spontaneous vaginal delivery) 02/11/2012  . Postpartum care following vaginal delivery 02/11/2012  . Perineal laceration with delivery, second degree 02/11/2012  . Rh negative, delivered, current hospitalization 02/11/2012    Past Surgical History  Procedure Laterality Date  . Wisdom tooth extraction    . Eye surgery      lasix   Family History  Problem Relation Age of Onset  . Hypothyroidism Maternal Grandmother   . Diabetes Father     paternal grandfather   . Pancreatic cancer    . Hodgkin's lymphoma      grandmother   . Breast cancer      aunt    History   Social History  . Marital Status: Married    Spouse Name: N/A  . Number of Children: N/A  . Years of Education: N/A  Occupational History  . Not on file.   Social History Main Topics  . Smoking status: Never Smoker   . Smokeless tobacco: Never Used  . Alcohol Use: No  . Drug Use: No  . Sexual Activity:    Partners: Male   Other Topics Concern  . Not on file   Social History Narrative     Objective: BP 102/68 mmHg  Pulse 63  Ht 5\' 3"  (1.6 m)  Wt 171 lb (77.565 kg)  BMI 30.30 kg/m2  LMP 08/21/2014  Vital signs reviewed. General: Alert and  Oriented, No Acute Distress HEENT: Pupils equal, round, reactive to light. Conjunctivae clear.  External ears unremarkable.  Moist mucous membranes. Lungs: Clear and comfortable work of breathing, speaking in full sentences without accessory muscle use. Cardiac: Regular rate and rhythm.  Neuro: CN II-XII grossly intact, gait normal. Extremities: No peripheral edema.  Strong peripheral pulses.  Back: No midline spinous process tenderness in the lumbar region, pain is localized overlying both posterior sacroiliac joints Mental Status: No depression, anxiety, nor agitation. Logical though process. Skin: Warm and dry.  Assessment & Plan: Kristina Casey was seen today for establish care.  Diagnoses and all orders for this visit:  Other fatigue Orders: -     Vit D  25 hydroxy (rtn osteoporosis monitoring) -     Vitamin B12 -     TSH  Vitamin D deficiency Orders: -     Vit D  25 hydroxy (rtn osteoporosis monitoring) -     Vitamin B12 -     TSH  Hypothyroidism, unspecified hypothyroidism type Orders: -     Vit D  25 hydroxy (rtn osteoporosis monitoring) -     Vitamin B12 -     TSH  Chronic SI joint pain   Hypothyroidism: Uncontrolled rechecking TSH today and plan to restart levothyroxine if still elevated. Vitamin D deficiency: Rechecking vitamin D level to see if she needs to restart another 3 months of supplementation. Fatigue: Rule out persistent vitamin D deficiency or B12 deficiency, low suspicion of sleep apnea at this time however discussed we may get a sleep study if no source of her fatigue is noticed after optimizing all vitamin and thyroid levels. 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  Ultimate follow-up will be based on the above results    Return if symptoms worsen or fail to improve.

## 2014-10-06 ENCOUNTER — Other Ambulatory Visit: Payer: Self-pay | Admitting: Family Medicine

## 2014-10-06 ENCOUNTER — Telehealth: Payer: Self-pay | Admitting: Family Medicine

## 2014-10-06 LAB — VITAMIN D 25 HYDROXY (VIT D DEFICIENCY, FRACTURES): VIT D 25 HYDROXY: 24 ng/mL — AB (ref 30–100)

## 2014-10-06 LAB — VITAMIN B12: VITAMIN B 12: 728 pg/mL (ref 211–911)

## 2014-10-06 LAB — TSH: TSH: 6.827 u[IU]/mL — ABNORMAL HIGH (ref 0.350–4.500)

## 2014-10-06 MED ORDER — LEVOTHYROXINE SODIUM 75 MCG PO TABS: 75.0000 ug | ORAL_TABLET | Freq: Every day | ORAL | Status: DC

## 2014-10-06 MED ORDER — VITAMIN D (ERGOCALCIFEROL) 1.25 MG (50000 UNIT) PO CAPS
50000.0000 [IU] | ORAL_CAPSULE | ORAL | Status: DC
Start: 2014-10-06 — End: 2015-01-06

## 2014-10-06 NOTE — Telephone Encounter (Signed)
Also Vit B12 was normal.

## 2014-10-06 NOTE — Telephone Encounter (Signed)
Pt.notified

## 2014-10-06 NOTE — Telephone Encounter (Signed)
Kristina Casey, Will you please let patient know that her Vit D level was still low and her hypothyroidism was confirmed.  I've sent a Rx for a weekly Vit D supplement and levothyroxine to [facility] in [place].  I'd recommend f/u in 3 months.

## 2014-10-10 ENCOUNTER — Telehealth: Payer: Self-pay | Admitting: *Deleted

## 2014-10-10 MED ORDER — LEVOTHYROXINE SODIUM 25 MCG PO TABS: 25.0000 ug | ORAL_TABLET | Freq: Every day | ORAL | Status: DC

## 2014-10-10 NOTE — Telephone Encounter (Signed)
Amber, If you mean levothyroxine then I'd recommend she reduce the dose to a formulation that I've sent to her [facility]

## 2014-10-10 NOTE — Telephone Encounter (Signed)
LMOM notifying pt of new rx. 

## 2014-10-10 NOTE — Telephone Encounter (Signed)
Pt left vm stating that after taking the levaquin for a couple days now she is feeling shaky, weak, and nauseated.  Does she need a different rx or should she tough it out? Please advise.

## 2015-01-06 ENCOUNTER — Encounter: Payer: Self-pay | Admitting: Family Medicine

## 2015-01-06 ENCOUNTER — Ambulatory Visit (INDEPENDENT_AMBULATORY_CARE_PROVIDER_SITE_OTHER): Payer: 59

## 2015-01-06 ENCOUNTER — Ambulatory Visit (INDEPENDENT_AMBULATORY_CARE_PROVIDER_SITE_OTHER): Payer: 59 | Admitting: Family Medicine

## 2015-01-06 VITALS — BP 107/63 | HR 75 | Temp 98.0°F | Wt 173.0 lb

## 2015-01-06 DIAGNOSIS — E559 Vitamin D deficiency, unspecified: Secondary | ICD-10-CM

## 2015-01-06 DIAGNOSIS — E039 Hypothyroidism, unspecified: Secondary | ICD-10-CM

## 2015-01-06 DIAGNOSIS — M545 Low back pain: Secondary | ICD-10-CM

## 2015-01-06 MED ORDER — PREDNISONE 20 MG PO TABS
ORAL_TABLET | ORAL | Status: AC
Start: 2015-01-06 — End: 2015-01-19

## 2015-01-06 NOTE — Progress Notes (Signed)
Xrays make sacroiliac osteoarthritis less likely, more likely facet degenerative changes causing her back pain therefore prednisone taper.

## 2015-01-06 NOTE — Addendum Note (Signed)
Addended by: Laren Boom on: 01/06/2015 12:12 PM   Modules accepted: Orders

## 2015-01-06 NOTE — Progress Notes (Signed)
CC: Kristina Casey is a 39 y.o. female is here for Back Pain and check thyroid   Subjective: HPI:  Follow-up hypothyroidism: 75 MCG of levothyroxine caused tremor, she switch to 25 MCG of levothyroxine and had no side effects. After 2 weeks of taking this medication she noticed that her energy level has improved and was no longer interfering with quality of life. She denies any constipation or diarrhea nor skin or hair changes.  Follow-up vitamin D deficiency: She is not sure if it was the levothyroxine or if it was the vitamin D but her energy level has improved now. She's no longer taking any vitamin D supplement.  Follow-up low back pain: The last 5 months now she has had persistent low back pain that is worse when going from a seated to standing position or actively sitting down. Topical sports creams are mildly effective at reducing the pain. Pain is moderate in severity. Pain is worse when defecating. Pain is described only as an ache, it is nonradiating. She denies any motor or sensory disturbances and lower extremities. She still localizes it to the lateral surfaces of the tailbone. Denies any overlying skin changes or worsening of the pain. No improvement with home rehabilitation exercises.   Review Of Systems Outlined In HPI  Past Medical History  Diagnosis Date  . Hypothyroidism   . Unspecified hemorrhoids without mention of complication   . Depression     PP with previous pregnancies  . NSVD (normal spontaneous vaginal delivery) 02/11/2012  . Postpartum care following vaginal delivery 02/11/2012  . Perineal laceration with delivery, second degree 02/11/2012  . Rh negative, delivered, current hospitalization 02/11/2012    Past Surgical History  Procedure Laterality Date  . Wisdom tooth extraction    . Eye surgery      lasix   Family History  Problem Relation Age of Onset  . Hypothyroidism Maternal Grandmother   . Diabetes Father     paternal grandfather   . Pancreatic cancer     . Hodgkin's lymphoma      grandmother   . Breast cancer      aunt    Social History   Social History  . Marital Status: Married    Spouse Name: N/A  . Number of Children: N/A  . Years of Education: N/A   Occupational History  . Not on file.   Social History Main Topics  . Smoking status: Never Smoker   . Smokeless tobacco: Never Used  . Alcohol Use: No  . Drug Use: No  . Sexual Activity:    Partners: Male   Other Topics Concern  . Not on file   Social History Narrative     Objective: BP 107/63 mmHg  Pulse 75  Temp(Src) 98 F (36.7 C) (Oral)  Wt 173 lb (78.472 kg)  Vital signs reviewed. General: Alert and Oriented, No Acute Distress HEENT: Pupils equal, round, reactive to light. Conjunctivae clear.  External ears unremarkable.  Moist mucous membranes. Pharynx unremarkable Lungs: Clear and comfortable work of breathing, speaking in full sentences without accessory muscle use. Cardiac: Regular rate and rhythm.  Neuro: CN II-XII grossly intact, gait normal. Extremities: No peripheral edema.  Strong peripheral pulses.  Mental Status: No depression, anxiety, nor agitation. Logical though process. Skin: Warm and dry.  Assessment & Plan: Kristina Casey was seen today for back pain and check thyroid.  Diagnoses and all orders for this visit:  Hypothyroidism, unspecified hypothyroidism type -     TSH  Vitamin D  deficiency -     Vit D  25 hydroxy (rtn osteoporosis monitoring)  Low back pain, unspecified back pain laterality, with sciatica presence unspecified -     DG Lumbar Spine Complete; Future   Hypothyroidism: Clinically improving due for TSH continue levothyroxine pending results Vitamin D deficiency: Clinically improved, checking vitamin D Low back pain: Chronic uncontrolled condition checking lumbar films with images of the sacroiliac joint to determine if she could benefit from SI joint injections or if there is some spinal alignment abnormality.  Return if  symptoms worsen or fail to improve.

## 2015-01-07 LAB — VITAMIN D 25 HYDROXY (VIT D DEFICIENCY, FRACTURES): Vit D, 25-Hydroxy: 31 ng/mL (ref 30–100)

## 2015-01-07 LAB — TSH: TSH: 4.172 u[IU]/mL (ref 0.350–4.500)

## 2015-01-09 ENCOUNTER — Telehealth: Payer: Self-pay | Admitting: Family Medicine

## 2015-01-09 MED ORDER — LEVOTHYROXINE SODIUM 25 MCG PO TABS: 25.0000 ug | ORAL_TABLET | Freq: Every day | ORAL | Status: DC

## 2015-01-09 NOTE — Telephone Encounter (Signed)
Sue Lush, Will you please let patient know that her thyroid supplement appears to be adequately dosed, I'll send refills of [facility]othyroxine to [facility].  Vitamin D is now in the normal range, this can be maintained with taking a 1000 Unit OTC vitamin d supplement.

## 2015-01-09 NOTE — Telephone Encounter (Signed)
Left message on vm with results  

## 2015-03-28 ENCOUNTER — Telehealth: Payer: Self-pay

## 2015-03-28 NOTE — Telephone Encounter (Signed)
Pt.notified

## 2015-03-28 NOTE — Telephone Encounter (Signed)
Yes needs appointment for better evaluation.

## 2015-03-28 NOTE — Telephone Encounter (Signed)
Pt called and reports that she is feeling fatigue, emotional, and having anxiety.  She would like lab work done.  Should she make an appointment first?  She is new to the practice you've seen her twice.

## 2015-04-11 ENCOUNTER — Encounter: Payer: Self-pay | Admitting: Family Medicine

## 2015-04-11 ENCOUNTER — Ambulatory Visit (INDEPENDENT_AMBULATORY_CARE_PROVIDER_SITE_OTHER): Payer: 59 | Admitting: Family Medicine

## 2015-04-11 VITALS — BP 107/72 | HR 73 | Wt 181.0 lb

## 2015-04-11 DIAGNOSIS — E039 Hypothyroidism, unspecified: Secondary | ICD-10-CM | POA: Diagnosis not present

## 2015-04-11 DIAGNOSIS — R5383 Other fatigue: Secondary | ICD-10-CM

## 2015-04-11 DIAGNOSIS — E559 Vitamin D deficiency, unspecified: Secondary | ICD-10-CM

## 2015-04-11 NOTE — Progress Notes (Signed)
CC: Kristina Casey is a 39 y.o. female is here for Fatigue; Shortness of Breath; Anxiety; and Weight Loss   Subjective: HPI:  Complains of daily fatigue from a mild to moderate degree that happens every day  for the last 4 months. Symptoms have not gone better or worse since onset.  nothing seems to make it better or worse. she occasionally reports nonrestorative sleep but this has been going on for years. Interventions included trying to take vitamin D supplements and  biotin supplements. She's also noticed hair loss on the scalp. She's had some shortness of breath when talking but never when exercising. She also felt anxious for reasons she's not sure about. She's tried a variety of different diets however she continues to gain weight. Denies fevers, chills, abdominal pain, diarrhea, constipation or nausea   Review Of Systems Outlined In HPI  Past Medical History  Diagnosis Date  . Hypothyroidism   . Unspecified hemorrhoids without mention of complication   . Depression     PP with previous pregnancies  . NSVD (normal spontaneous vaginal delivery) 02/11/2012  . Postpartum care following vaginal delivery 02/11/2012  . Perineal laceration with delivery, second degree 02/11/2012  . Rh negative, delivered, current hospitalization 02/11/2012    Past Surgical History  Procedure Laterality Date  . Wisdom tooth extraction    . Eye surgery      lasix   Family History  Problem Relation Age of Onset  . Hypothyroidism Maternal Grandmother   . Diabetes Father     paternal grandfather   . Pancreatic cancer    . Hodgkin's lymphoma      grandmother   . Breast cancer      aunt    Social History   Social History  . Marital Status: Married    Spouse Name: N/A  . Number of Children: N/A  . Years of Education: N/A   Occupational History  . Not on file.   Social History Main Topics  . Smoking status: Never Smoker   . Smokeless tobacco: Never Used  . Alcohol Use: No  . Drug Use: No  .  Sexual Activity:    Partners: Male   Other Topics Concern  . Not on file   Social History Narrative     Objective: BP 107/72 mmHg  Pulse 73  Wt 181 lb (82.101 kg)  General: Alert and Oriented, No Acute Distress HEENT: Pupils equal, round, reactive to light. Conjunctivae clear.  moist mucous membranes Lungs: Clear to auscultation bilaterally, no wheezing/ronchi/rales.  Comfortable work of breathing. Good air movement. Cardiac: Regular rate and rhythm. Normal S1/S2.  No murmurs, rubs, nor gallops.   Extremities: No peripheral edema.  Strong peripheral pulses.  Mental Status: No depression, anxiety, nor agitation. Skin: Warm and dry.  Assessment & Plan: Kristina Casey was seen today for fatigue, shortness of breath, anxiety and weight loss.  Diagnoses and all orders for this visit:  Hypothyroidism, unspecified hypothyroidism type -     TSH  Vitamin D deficiency  Other fatigue -     TSH -     CBC -     COMPLETE METABOLIC PANEL WITH GFR -     Vitamin B12    fatigue: Rule out uncontrolled hypothyroidism, metabolic abnormality or vitamin deficiency. Rule out anemia.  Return if symptoms worsen or fail to improve.

## 2015-04-12 ENCOUNTER — Encounter: Payer: Self-pay | Admitting: Family Medicine

## 2015-04-12 ENCOUNTER — Telehealth: Payer: Self-pay | Admitting: Family Medicine

## 2015-04-12 DIAGNOSIS — D649 Anemia, unspecified: Secondary | ICD-10-CM | POA: Insufficient documentation

## 2015-04-12 LAB — COMPLETE METABOLIC PANEL WITH GFR
ALT: 37 U/L — ABNORMAL HIGH (ref 6–29)
AST: 24 U/L (ref 10–30)
Albumin: 3.8 g/dL (ref 3.6–5.1)
Alkaline Phosphatase: 69 U/L (ref 33–115)
BUN: 11 mg/dL (ref 7–25)
CHLORIDE: 106 mmol/L (ref 98–110)
CO2: 23 mmol/L (ref 20–31)
Calcium: 8.3 mg/dL — ABNORMAL LOW (ref 8.6–10.2)
Creat: 0.75 mg/dL (ref 0.50–1.10)
GFR, Est African American: >89 mL/min (ref >=60)
GLUCOSE: 79 mg/dL (ref 65–99)
POTASSIUM: 3.6 mmol/L (ref 3.5–5.3)
SODIUM: 139 mmol/L (ref 135–146)
Total Bilirubin: 0.3 mg/dL (ref 0.2–1.2)
Total Protein: 6.9 g/dL (ref 6.1–8.1)

## 2015-04-12 LAB — CBC
HCT: 36 % (ref 36.0–46.0)
Hemoglobin: 11.5 g/dL — ABNORMAL LOW (ref 12.0–15.0)
MCH: 25.6 pg — AB (ref 26.0–34.0)
MCHC: 31.9 g/dL (ref 30.0–36.0)
MCV: 80 fL (ref 78.0–100.0)
MPV: 10.4 fL (ref 8.6–12.4)
Platelets: 222 10*3/uL (ref 150–400)
RBC: 4.5 MIL/uL (ref 3.87–5.11)
RDW: 14 % (ref 11.5–15.5)
WBC: 8.1 10*3/uL (ref 4.0–10.5)

## 2015-04-12 LAB — TSH: TSH: 3.463 u[IU]/mL (ref 0.350–4.500)

## 2015-04-12 LAB — VITAMIN B12: Vitamin B-12: 436 pg/mL (ref 211–911)

## 2015-04-12 MED ORDER — FERROUS SULFATE 325 (65 FE) MG PO TBEC: 325.0000 mg | DELAYED_RELEASE_TABLET | Freq: Every day | ORAL | Status: DC

## 2015-04-12 NOTE — Telephone Encounter (Signed)
Awaiting call back.

## 2015-04-12 NOTE — Telephone Encounter (Signed)
Will you please let patient know that her blood work was significant only for a mild anemia but this would be consistent with her symptoms she was describing on Tuesday.  This can be improved with taking a 325mg  OTC Ferrous Sulfate supplement with breakfast.  I'd recommend f/u in one month to recheck this.

## 2015-04-12 NOTE — Telephone Encounter (Signed)
Pt advised.

## 2015-04-20 ENCOUNTER — Telehealth: Payer: Self-pay

## 2015-04-20 NOTE — Telephone Encounter (Signed)
Pt called reporting that she is taking the OTC iron supplement.  She does not have any energy and is currently fighting off a cold.  Any suggestions on how to get her energy level up?

## 2015-04-21 NOTE — Telephone Encounter (Signed)
Begin taking the iron supplement twice a day, it can take up to a month to notice improvements.

## 2015-04-21 NOTE — Telephone Encounter (Signed)
Pt.notified

## 2015-05-24 ENCOUNTER — Telehealth: Payer: Self-pay

## 2015-05-24 DIAGNOSIS — D649 Anemia, unspecified: Secondary | ICD-10-CM

## 2015-05-24 NOTE — Telephone Encounter (Signed)
Kristina GlennMonica reports that she has a little more energy but is still not where she would like to be. She is also c/o SOB.  Pt would like to have her iron levels checked.  Do this request require an office visit?

## 2015-05-25 NOTE — Telephone Encounter (Signed)
Pt.notified

## 2015-05-25 NOTE — Telephone Encounter (Signed)
This can be handled with a lab visit, orders in your in box

## 2015-05-26 LAB — HEMOGLOBIN: Hemoglobin: 12.2 g/dL (ref 12.0–15.0)

## 2015-05-26 LAB — IRON AND TIBC
%SAT: 28 % (ref 11–50)
Iron: 99 ug/dL (ref 40–190)
TIBC: 354 ug/dL (ref 250–450)
UIBC: 255 ug/dL (ref 125–400)

## 2015-05-26 LAB — CALCIUM: CALCIUM: 8.9 mg/dL (ref 8.6–10.2)

## 2015-05-30 ENCOUNTER — Ambulatory Visit (INDEPENDENT_AMBULATORY_CARE_PROVIDER_SITE_OTHER): Payer: 59 | Admitting: Family Medicine

## 2015-05-30 ENCOUNTER — Encounter: Payer: Self-pay | Admitting: Family Medicine

## 2015-05-30 VITALS — BP 106/69 | HR 60 | Wt 185.0 lb

## 2015-05-30 DIAGNOSIS — G478 Other sleep disorders: Secondary | ICD-10-CM

## 2015-05-30 DIAGNOSIS — D649 Anemia, unspecified: Secondary | ICD-10-CM

## 2015-05-30 NOTE — Progress Notes (Signed)
CC: Kristina Casey is a [age] y.o. female is here for follow up with iron; Shortness of Breath; and Weight Gain   Subjective: HPI:   follow-up fatigue:   Despite hemoglobin, TSH, vitamin D and B12 being normal she continues to have fatigue on a daily basis. She is experiencing this throughout the day. It's a little bit better since her hemoglobin was improved but still mild to moderate on daily basis. She reports nonrestorative sleep  Despite getting 6-7 hours of sleep on a nightly basis.  Nothing seems to make symptoms better or worse since hemoglobin was normalized. She denies depression or anxiety. It's accompanied by unintentional weight gain and occasional cognitive " fogginess".  She's never had a sleep test before. No known witnessed sleep apnea symptoms. She's not sure if she snores. She denies weakness but has had some shortness of breath with exertion   Follow-up anemia: She denies any abnormal bleeding or bruising. No chest pain.   Review Of Systems Outlined In HPI  Past Medical History  Diagnosis Date  . Hypothyroidism   . Unspecified hemorrhoids without mention of complication   . Depression     PP with previous pregnancies  . NSVD (normal spontaneous vaginal delivery) 02/11/2012  . Postpartum care following vaginal delivery 02/11/2012  . Perineal laceration with delivery, second degree 02/11/2012  . Rh negative, delivered, current hospitalization 02/11/2012    Past Surgical History  Procedure Laterality Date  . Wisdom tooth extraction    . Eye surgery      lasix   Family History  Problem Relation Age of Onset  . Hypothyroidism Maternal Grandmother   . Diabetes Father     paternal grandfather   . Pancreatic cancer    . Hodgkin's lymphoma      grandmother   . Breast cancer      aunt    Social History   Social History  . Marital Status: Married    Spouse Name: N/A  . Number of Children: N/A  . Years of Education: N/A   Occupational History  . Not on file.   Social  History Main Topics  . Smoking status: Never Smoker   . Smokeless tobacco: Never Used  . Alcohol Use: No  . Drug Use: No  . Sexual Activity:    Partners: Male   Other Topics Concern  . Not on file   Social History Narrative     Objective: BP 106/69 mmHg  Pulse 60  Wt 185 lb (83.915 kg)  Vital signs reviewed. General: Alert and Oriented, No Acute Distress HEENT: Pupils equal, round, reactive to light. Conjunctivae clear.  External ears unremarkable.  Moist mucous membranes. Lungs: Clear and comfortable work of breathing, speaking in full sentences without accessory muscle use. Cardiac: Regular rate and rhythm.  Neuro: CN II-XII grossly intact, gait normal. Extremities: No peripheral edema.  Strong peripheral pulses.  Mental Status: No depression, anxiety, nor agitation. Logical though process. Skin: Warm and dry.  Assessment & Plan: Kristina GlennMonica was seen today for follow up with iron, shortness of breath and weight gain.  Diagnoses and all orders for this visit:  Anemia, unspecified anemia type  Non-restorative sleep -     Home sleep test   Fatigue and nonrestorative sleep: Chronic uncontrolled condition next step will be a home sleep test to screen for sleep apnea. Anemia: Resolved with iron supplementation, she can stop taking this since there is no new abnormal bleeding or bruising.   No Follow-up on file.

## 2015-06-08 ENCOUNTER — Ambulatory Visit (INDEPENDENT_AMBULATORY_CARE_PROVIDER_SITE_OTHER): Payer: 59 | Admitting: Family Medicine

## 2015-06-08 ENCOUNTER — Encounter: Payer: Self-pay | Admitting: Family Medicine

## 2015-06-08 VITALS — BP 120/65 | HR 75 | Temp 98.2°F | Wt 185.0 lb

## 2015-06-08 DIAGNOSIS — J029 Acute pharyngitis, unspecified: Secondary | ICD-10-CM | POA: Diagnosis not present

## 2015-06-08 LAB — POCT RAPID STREP A (OFFICE): Rapid Strep A Screen: NEGATIVE

## 2015-06-08 MED ORDER — CEFUROXIME AXETIL 500 MG PO TABS: 500.0000 mg | ORAL_TABLET | Freq: Two times a day (BID) | ORAL | Status: DC

## 2015-06-08 NOTE — Progress Notes (Signed)
       Kristina Casey is a 39 y.o. female who presents to [facility] Care today for sore throat. Patient has several day history of sore throat associated with a mild headache. No fevers chills nausea vomiting diarrhea cough or congestion. 3 of her 4 children have been diagnosed with strep throat recently. She has not tried any medications.   Past Medical History  Diagnosis Date  . Hypothyroidism   . Unspecified hemorrhoids without mention of complication   . Depression     PP with previous pregnancies  . NSVD (normal spontaneous vaginal delivery) 02/11/2012  . Postpartum care following vaginal delivery 02/11/2012  . Perineal laceration with delivery, second degree 02/11/2012  . Rh negative, delivered, current hospitalization 02/11/2012   Past Surgical History  Procedure Laterality Date  . Wisdom tooth extraction    . Eye surgery      lasix   Social History  Substance Use Topics  . Smoking status: Never Smoker   . Smokeless tobacco: Never Used  . Alcohol Use: No   family history includes Diabetes in her father; Hypothyroidism in her maternal grandmother.  ROS as above Medications: Current Outpatient Prescriptions  Medication Sig Dispense Refill  . ferrous sulfate 325 (65 FE) MG EC tablet Take 1 tablet (325 mg total) by mouth daily with breakfast.  3  . levothyroxine (SYNTHROID, LEVOTHROID) 25 MCG tablet Take 1 tablet (25 mcg total) by mouth daily before breakfast. 90 tablet 1  . pantoprazole (PROTONIX) 20 MG tablet Take 1 tablet (20 mg total) by mouth daily. 30 tablet 00  . cefUROXime (CEFTIN) 500 MG tablet Take 1 tablet (500 mg total) by mouth 2 (two) times daily with a meal. 14 tablet 0   No current facility-administered medications for this visit.   Allergies  Allergen Reactions  . Amoxicillin Other (See Comments)     Exam:  BP 120/65 mmHg  Pulse 75  Temp(Src) 98.2 F (36.8  C) (Oral)  Wt 185 lb (83.915 kg) Gen: Well NAD HEENT: EOMI,  MMM history pharynx is erythematous without exudate. Cervical lymphadenopathy is present bilaterally. Clear nasal discharge present. Lungs: Normal work of breathing. CTABL Heart: RRR no MRG Abd: NABS, Soft. Nondistended, Nontender Exts: Brisk capillary refill, warm and well perfused.   Results for orders placed or performed in visit on 06/08/15 (from the past 24 hour(s))  POCT rapid strep A     Status: Normal   Collection Time: 06/08/15  1:59 PM  Result Value Ref Range   Rapid Strep A Screen Negative Negative   No results found.   Please see individual assessment and plan sections.

## 2015-06-08 NOTE — Assessment & Plan Note (Signed)
Symptoms likely due to viral pharyngitis. However the throat looks a little worse than I would expect based on the rapid strep test. Will back up cephalosporin  antibiotic to use if patient worsens.

## 2015-06-08 NOTE — Patient Instructions (Signed)
Thank you for coming in today. Take ibuprofen or tylenol for pain.  Use Ceftin if not better or worsening.   Pharyngitis Pharyngitis is redness, pain, and swelling (inflammation) of your pharynx.  CAUSES  Pharyngitis is usually caused by infection. Most of the time, these infections are from viruses (viral) and are part of a cold. However, sometimes pharyngitis is caused by bacteria (bacterial). Pharyngitis can also be caused by allergies. Viral pharyngitis may be spread from person to person by coughing, sneezing, and personal items or utensils (cups, forks, spoons, toothbrushes). Bacterial pharyngitis may be spread from person to person by more intimate contact, such as kissing.  SIGNS AND SYMPTOMS  Symptoms of pharyngitis include:   Sore throat.   Tiredness (fatigue).   Low-grade fever.   Headache.  Joint pain and muscle aches.  Skin rashes.  Swollen lymph nodes.  Plaque-like film on throat or tonsils (often seen with bacterial pharyngitis). DIAGNOSIS  Your health care provider will ask you questions about your illness and your symptoms. Your medical history, along with a physical exam, is often all that is needed to diagnose pharyngitis. Sometimes, a rapid strep test is done. Other lab tests may also be done, depending on the suspected cause.  TREATMENT  Viral pharyngitis will usually get better in 3-4 days without the use of medicine. Bacterial pharyngitis is treated with medicines that kill germs (antibiotics).  HOME CARE INSTRUCTIONS   Drink enough water and fluids to keep your urine clear or pale yellow.   Only take over-the-counter or prescription medicines as directed by your health care provider:   If you are prescribed antibiotics, make sure you finish them even if you start to feel better.   Do not take aspirin.   Get lots of rest.   Gargle with 8 oz of salt water ( tsp of salt per 1 qt of water) as often as every 1-2 hours to soothe your throat.    Throat lozenges (if you are not at risk for choking) or sprays may be used to soothe your throat. SEEK MEDICAL CARE IF:   You have large, tender lumps in your neck.  You have a rash.  You cough up green, yellow-brown, or bloody spit. SEEK IMMEDIATE MEDICAL CARE IF:   Your neck becomes stiff.  You drool or are unable to swallow liquids.  You vomit or are unable to keep medicines or liquids down.  You have severe pain that does not go away with the use of recommended medicines.  You have trouble breathing (not caused by a stuffy nose). MAKE SURE YOU:   Understand these instructions.  Will watch your condition.  Will get help right away if you are not doing well or get worse.   This information is not intended to replace advice given to you by your health care provider. Make sure you discuss any questions you have with your health care provider.   Document Released: 05/06/2005 Document Revised: 02/24/2013 Document Reviewed: 01/11/2013 Elsevier Interactive Patient Education [employer]

## 2015-06-21 ENCOUNTER — Ambulatory Visit (HOSPITAL_BASED_OUTPATIENT_CLINIC_OR_DEPARTMENT_OTHER): Payer: 59 | Attending: Family Medicine | Admitting: Radiology

## 2015-06-21 VITALS — Ht 63.0 in | Wt 180.0 lb

## 2015-06-21 DIAGNOSIS — G4700 Insomnia, unspecified: Secondary | ICD-10-CM | POA: Diagnosis present

## 2015-06-21 DIAGNOSIS — R5383 Other fatigue: Secondary | ICD-10-CM

## 2015-06-21 DIAGNOSIS — R0683 Snoring: Secondary | ICD-10-CM | POA: Insufficient documentation

## 2015-07-01 DIAGNOSIS — R5383 Other fatigue: Secondary | ICD-10-CM | POA: Diagnosis not present

## 2015-07-01 NOTE — Progress Notes (Signed)
   Patient Name: Kristina, Casey Date: 06/21/2015 Gender: Female D.O.B: 1975/08/06 Age (years): 39 Referring Provider: Laren Boom Height (inches): 63 Interpreting Physician: Jetty Duhamel MD, ABSM Weight (lbs): 180 RPSGT: [place] BMI: 32 MRN: [id number] Neck Size: 13.00 CLINICAL INFORMATION Sleep Study Type:  unattended HST    Indication for sleep study: Non-restorative sleep   Epworth Sleepiness Score: 15 SLEEP STUDY TECHNIQUE A multi-channel overnight portable sleep study was performed. The channels recorded were: nasal airflow, thoracic respiratory movement, and oxygen saturation with a pulse oximetry. Snoring was also monitored.  MEDICATIONS Patient self administered medications include: N/A. Home meds charted for review  SLEEP ARCHITECTURE Patient was studied for 409.2 minutes. The sleep efficiency was 97.7 % and the patient was supine for 60.9%. The arousal index was 0.0 per hour.  RESPIRATORY PARAMETERS The overall AHI was 2.8 per hour, with a central apnea index of 0.9 per hour. The oxygen nadir was 89% during sleep. Mean saturation 94%   CARDIAC DATA Mean heart rate during sleep was 60.3 bpm.  IMPRESSIONS - No significant obstructive sleep apnea occurred during this study (AHI = 2.8/h). - No significant central sleep apnea occurred during this study (CAI = 0.9/h). - Mild oxygen desaturation was noted during this study (Min O2 = 89%). - Patient snored during 0.4% of sleep time.  DIAGNOSIS - Normal study  RECOMMENDATIONS - Avoid alcohol, sedatives and other CNS depressants that may worsen sleep apnea and disrupt normal sleep architecture. - Sleep hygiene should be reviewed to assess factors that may improve sleep quality. - Weight management and regular exercise should be initiated or continued.  Waymon Budge Diplomate, American Board of Sleep Medicine  ELECTRONICALLY SIGNED ON:  07/01/2015, 10:49 AM [facility] PH: [phone]   FX: [phone] ACCREDITED BY THE AMERICAN ACADEMY OF SLEEP MEDICINE

## 2015-07-12 ENCOUNTER — Telehealth: Payer: Self-pay | Admitting: Family Medicine

## 2015-07-12 MED ORDER — MODAFINIL 200 MG PO TABS: 200.0000 mg | ORAL_TABLET | Freq: Every day | ORAL | Status: DC

## 2015-07-12 NOTE — Telephone Encounter (Signed)
Pt.notified

## 2015-07-12 NOTE — Telephone Encounter (Signed)
Will you please let patient know that her sleep study was normal which rules out sleep apnea.  Since I can't seem to find the source of her fatigue I'd recommend starting a stimulant called provigil that I'll have to print off. Please follow up 3-4 weeks after starting this medication.

## 2015-07-19 ENCOUNTER — Telehealth: Payer: Self-pay | Admitting: *Deleted

## 2015-07-19 ENCOUNTER — Telehealth: Payer: Self-pay | Admitting: Family Medicine

## 2015-07-19 DIAGNOSIS — G4711 Idiopathic hypersomnia with long sleep time: Secondary | ICD-10-CM

## 2015-07-19 NOTE — Telephone Encounter (Signed)
Kristina Casey, Will you please submit another PA for Modafinil through [employer].  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 Denial letter in the PA folder.

## 2015-07-19 NOTE — Telephone Encounter (Signed)
Modafinil was denied by insurance. Denial letter placed in Kristina Casey's box

## 2015-07-24 NOTE — Telephone Encounter (Signed)
A PA has been resubmitted with updated information. Key# [id number]

## 2015-07-27 NOTE — Telephone Encounter (Signed)
The Modafinil has been approved  Until 07-23-2016 ir until coverage is no longer avail under the benefit.  Pt notified and pharm also notified via vm

## 2015-08-21 ENCOUNTER — Ambulatory Visit (INDEPENDENT_AMBULATORY_CARE_PROVIDER_SITE_OTHER): Payer: 59 | Admitting: Family Medicine

## 2015-08-21 ENCOUNTER — Encounter: Payer: Self-pay | Admitting: Family Medicine

## 2015-08-21 VITALS — BP 102/65 | HR 76 | Wt 172.0 lb

## 2015-08-21 DIAGNOSIS — L309 Dermatitis, unspecified: Secondary | ICD-10-CM | POA: Diagnosis not present

## 2015-08-21 MED ORDER — TRIAMCINOLONE ACETONIDE 0.1 % EX CREA: TOPICAL_CREAM | CUTANEOUS | Status: AC

## 2015-08-21 NOTE — Progress Notes (Signed)
CC: Kristina Casey is a 40 y.o. female is here for No chief complaint on file.   Subjective: HPI:  4 weeks ago she had a hair underneath her chin that she plucked in the next day she noticed it well and redness. It seems to fluctuate in size and redness however is always itchy. She denies any clinical intervention as of yet. She's also noticed a similar spot on her left elbow. She's changed a number of different detergents however symptoms have not gotten better or worse. Denies fevers, chills, swollen lymph nodes nor unintentional weight loss. Denies joint pain   Review Of Systems Outlined In HPI  Past Medical History  Diagnosis Date  . Hypothyroidism   . Unspecified hemorrhoids without mention of complication   . Depression     PP with previous pregnancies  . NSVD (normal spontaneous vaginal delivery) 02/11/2012  . Postpartum care following vaginal delivery 02/11/2012  . Perineal laceration with delivery, second degree 02/11/2012  . Rh negative, delivered, current hospitalization 02/11/2012    Past Surgical History  Procedure Laterality Date  . Wisdom tooth extraction    . Eye surgery      lasix   Family History  Problem Relation Age of Onset  . Hypothyroidism Maternal Grandmother   . Diabetes Father     paternal grandfather   . Pancreatic cancer    . Hodgkin's lymphoma      grandmother   . Breast cancer      aunt    Social History   Social History  . Marital Status: Married    Spouse Name: N/A  . Number of Children: N/A  . Years of Education: N/A   Occupational History  . Not on file.   Social History Main Topics  . Smoking status: Never Smoker   . Smokeless tobacco: Never Used  . Alcohol Use: No  . Drug Use: No  . Sexual Activity:    Partners: Male   Other Topics Concern  . Not on file   Social History Narrative     Objective: BP 102/65 mmHg  Pulse 76  Wt 172 lb (78.019 kg)  Vital signs reviewed. General: Alert and Oriented, No Acute Distress HEENT:  Pupils equal, round, reactive to light. Conjunctivae clear.  External ears unremarkable.  Moist mucous membranes. Lungs: Clear and comfortable work of breathing, speaking in full sentences without accessory muscle use. Cardiac: Regular rate and rhythm.  Neuro: CN II-XII grossly intact, gait normal. Extremities: No peripheral edema.  Strong peripheral pulses.  Mental Status: No depression, anxiety, nor agitation. Logical though process. Skin: Warm and dry. Mild erythematous changes just beneath the left angle of the mandible and on the left extensor surface of the elbow  Assessment & Plan: Diagnoses and all orders for this visit:  Dermatitis  Other orders -     triamcinolone cream (KENALOG) 0.1 %; Apply to affected areas twice a day for up to two weeks, avoid face.   Call if not improving by next week.  Return if symptoms worsen or fail to improve.

## 2015-10-02 ENCOUNTER — Other Ambulatory Visit: Payer: Self-pay | Admitting: Family Medicine

## 2015-10-03 ENCOUNTER — Other Ambulatory Visit: Payer: Self-pay | Admitting: Family Medicine

## 2015-11-23 ENCOUNTER — Other Ambulatory Visit: Payer: Self-pay | Admitting: Family Medicine

## 2016-02-07 IMAGING — CR DG LUMBAR SPINE COMPLETE 4+V
5 series · 5 of 5 positions shown · non-contrast
Comparison: None.

CLINICAL DATA: Back pain.  No known injury.  Initial evaluation.

EXAM:
LUMBAR SPINE - COMPLETE 4+ VIEW

[l-spine ap]
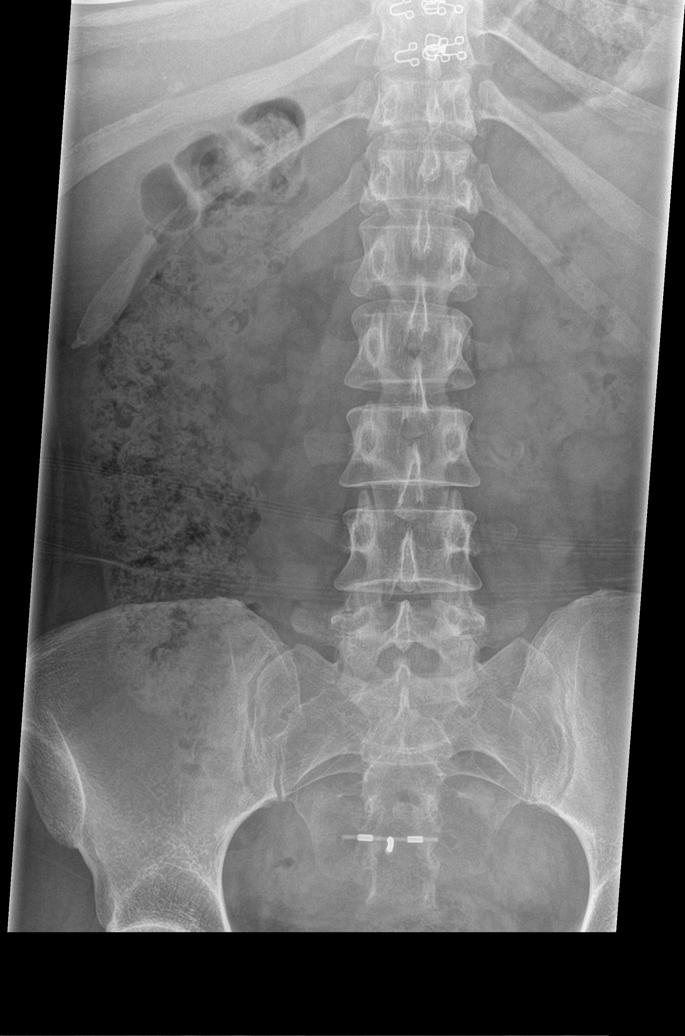

[l-spine obl (1 of 2)]
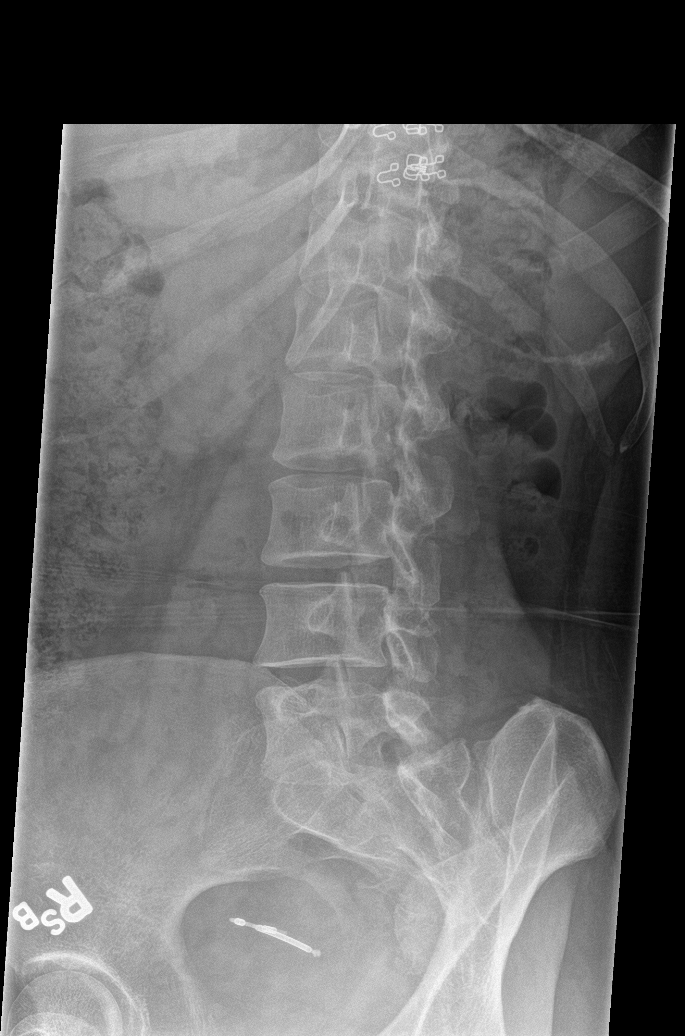

[l-spine obl (2 of 2)]
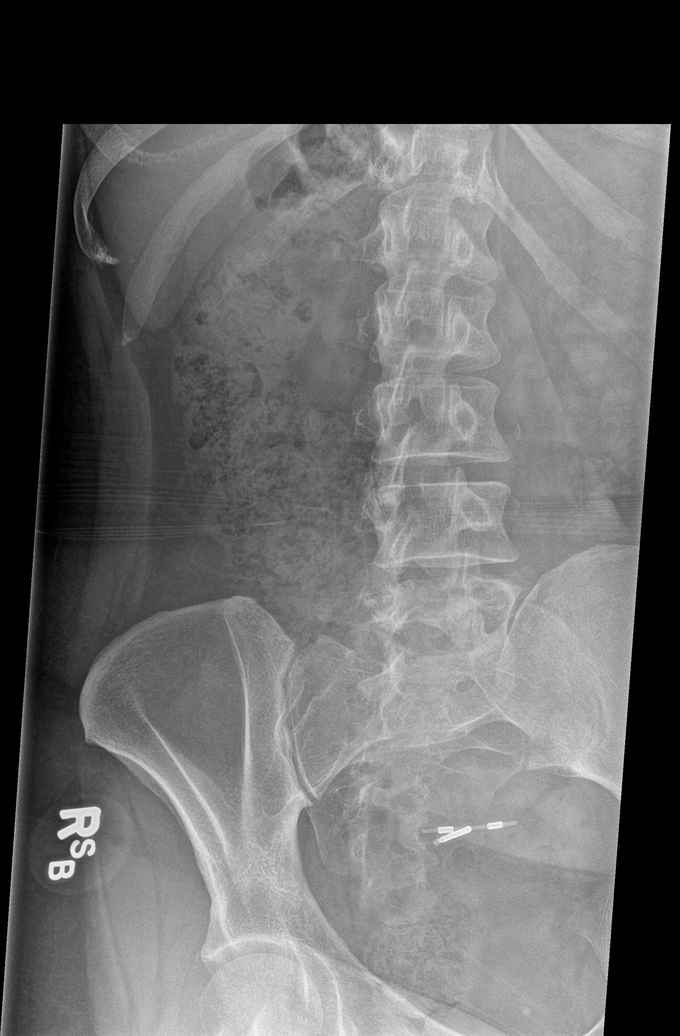

[l-spine lat]
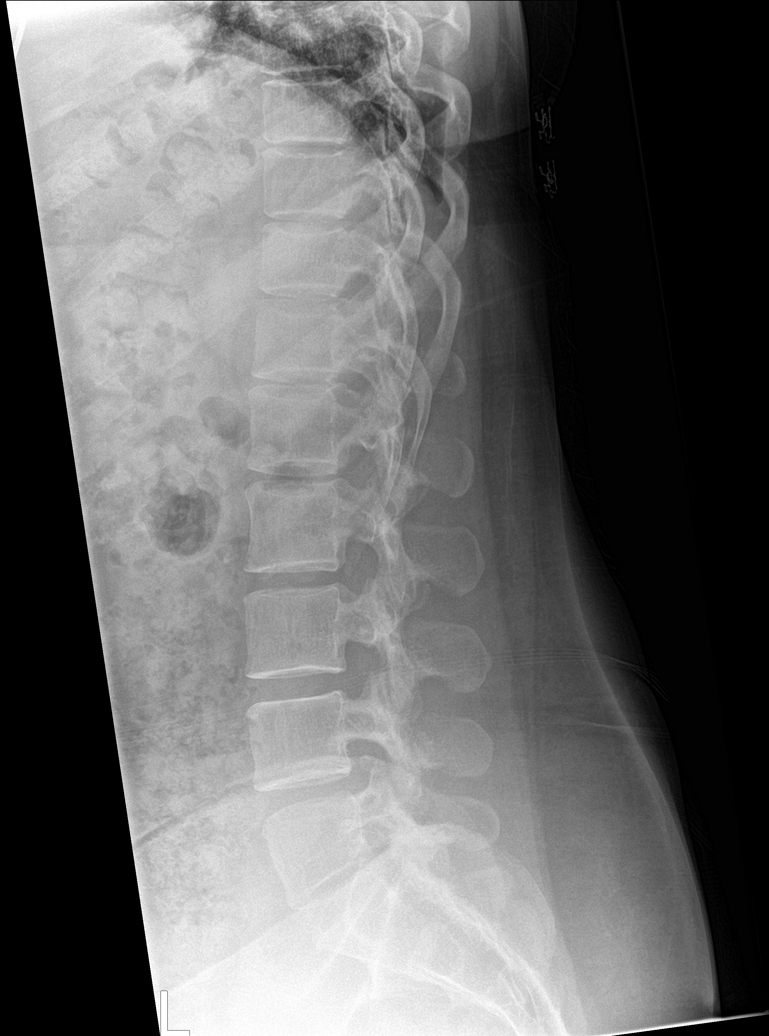

[l-spine spot]
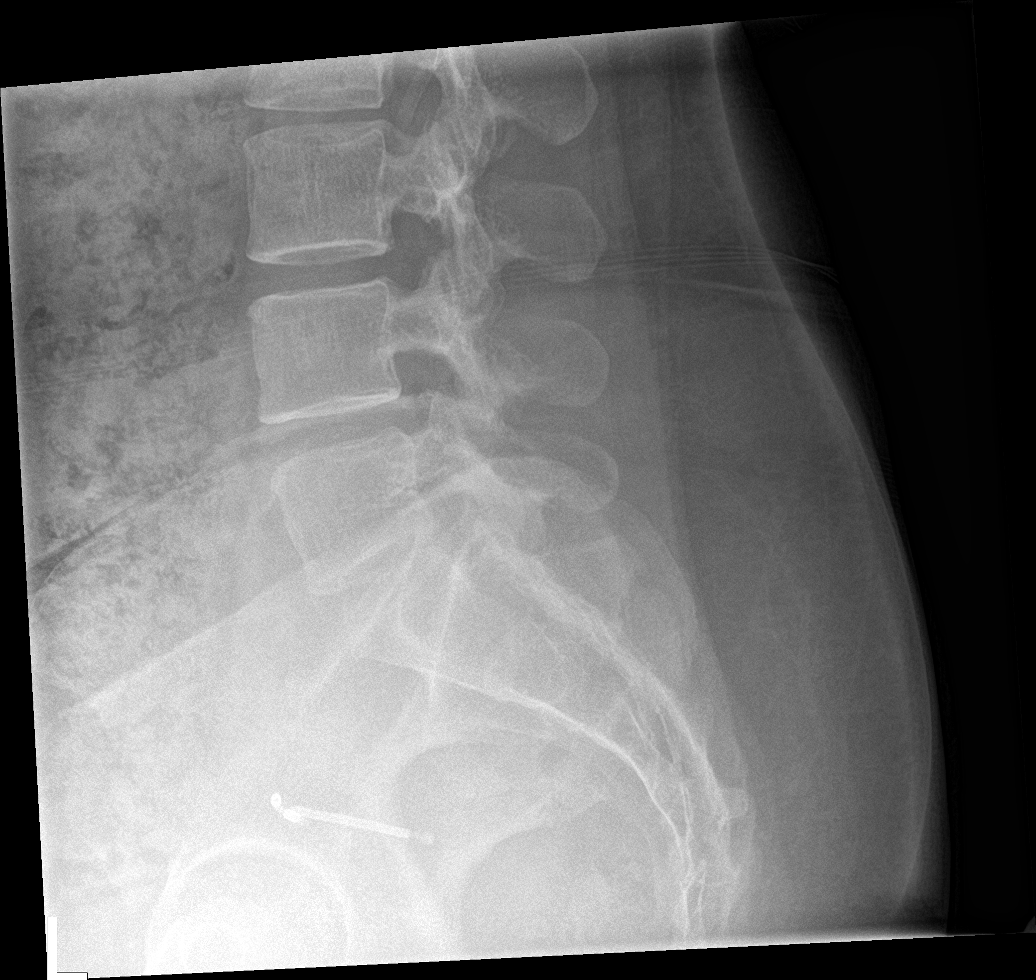

[5 of 5 positions shown; findings below may reference images not displayed]

FINDINGS: Multilevel degenerative change. No acute abnormality identified.
Normal alignment. IUD noted in the pelvis.
IMPRESSION: No acute or focal abnormality.

## 2016-03-22 ENCOUNTER — Other Ambulatory Visit: Payer: Self-pay | Admitting: *Deleted

## 2016-03-22 MED ORDER — LEVOTHYROXINE SODIUM 25 MCG PO TABS: ORAL_TABLET | ORAL | 0 refills | Status: AC

## 2016-03-28 ENCOUNTER — Telehealth: Payer: Self-pay | Admitting: Family Medicine

## 2016-03-28 NOTE — Telephone Encounter (Signed)
I called pt and left a message informing her to call and establish with a pcp now that Dr.Hommel is no longer here and that way she does not run out of meds

## 2021-11-27 ENCOUNTER — Other Ambulatory Visit: Payer: Self-pay

## 2021-12-05 ENCOUNTER — Other Ambulatory Visit: Payer: Self-pay
